# Patient Record
Sex: Female | Born: 1935 | Race: White | Hispanic: No | Smoking: Former smoker
Health system: Southern US, Community
[De-identification: ages and names within clinical notes are randomized; demographics above are authoritative.]

## PROBLEM LIST (undated history)

## (undated) DIAGNOSIS — K2270 Barrett's esophagus without dysplasia: Principal | ICD-10-CM

## (undated) DIAGNOSIS — E785 Hyperlipidemia, unspecified: Secondary | ICD-10-CM

## (undated) DIAGNOSIS — H9190 Unspecified hearing loss, unspecified ear: Secondary | ICD-10-CM

## (undated) DIAGNOSIS — H269 Unspecified cataract: Secondary | ICD-10-CM

## (undated) DIAGNOSIS — R51 Headache: Secondary | ICD-10-CM

## (undated) DIAGNOSIS — R519 Headache, unspecified: Secondary | ICD-10-CM

## (undated) DIAGNOSIS — K219 Gastro-esophageal reflux disease without esophagitis: Secondary | ICD-10-CM

## (undated) DIAGNOSIS — G629 Polyneuropathy, unspecified: Secondary | ICD-10-CM

## (undated) DIAGNOSIS — C801 Malignant (primary) neoplasm, unspecified: Secondary | ICD-10-CM

## (undated) HISTORY — DX: Unspecified hearing loss, unspecified ear: H91.90

## (undated) HISTORY — PX: UPPER GI ENDOSCOPY: SHX6162

## (undated) HISTORY — PX: COLONOSCOPY: SHX174

## (undated) HISTORY — DX: Barrett's esophagus without dysplasia: K22.70

## (undated) HISTORY — DX: Gastro-esophageal reflux disease without esophagitis: K21.9

---

## 1998-06-10 ENCOUNTER — Ambulatory Visit (HOSPITAL_COMMUNITY): Admission: RE | Admit: 1998-06-10 | Discharge: 1998-06-10 | Payer: Self-pay | Admitting: Internal Medicine

## 1998-08-06 ENCOUNTER — Other Ambulatory Visit: Admission: RE | Admit: 1998-08-06 | Discharge: 1998-08-06 | Payer: Self-pay | Admitting: Obstetrics and Gynecology

## 1998-11-04 ENCOUNTER — Other Ambulatory Visit: Admission: RE | Admit: 1998-11-04 | Discharge: 1998-11-04 | Payer: Self-pay | Admitting: Internal Medicine

## 1998-11-08 HISTORY — PX: POLYPECTOMY: SHX149

## 2000-02-01 ENCOUNTER — Other Ambulatory Visit: Admission: RE | Admit: 2000-02-01 | Discharge: 2000-02-01 | Payer: Self-pay | Admitting: Obstetrics and Gynecology

## 2001-01-31 ENCOUNTER — Other Ambulatory Visit: Admission: RE | Admit: 2001-01-31 | Discharge: 2001-01-31 | Payer: Self-pay | Admitting: Obstetrics and Gynecology

## 2001-11-08 HISTORY — PX: BREAST LUMPECTOMY: SHX2

## 2002-04-18 ENCOUNTER — Other Ambulatory Visit: Admission: RE | Admit: 2002-04-18 | Discharge: 2002-04-18 | Payer: Self-pay | Admitting: Obstetrics and Gynecology

## 2002-04-24 ENCOUNTER — Other Ambulatory Visit: Admission: RE | Admit: 2002-04-24 | Discharge: 2002-04-24 | Payer: Self-pay | Admitting: Radiology

## 2002-05-21 ENCOUNTER — Other Ambulatory Visit: Admission: RE | Admit: 2002-05-21 | Discharge: 2002-05-21 | Payer: Self-pay | Admitting: Obstetrics and Gynecology

## 2002-09-04 ENCOUNTER — Ambulatory Visit: Admission: RE | Admit: 2002-09-04 | Discharge: 2002-11-19 | Payer: Self-pay | Admitting: Radiation Oncology

## 2002-09-20 ENCOUNTER — Ambulatory Visit (HOSPITAL_COMMUNITY): Admission: RE | Admit: 2002-09-20 | Discharge: 2002-09-20 | Payer: Self-pay | Admitting: Radiation Oncology

## 2002-09-27 ENCOUNTER — Encounter: Payer: Self-pay | Admitting: Radiation Oncology

## 2002-09-27 ENCOUNTER — Ambulatory Visit (HOSPITAL_COMMUNITY): Admission: RE | Admit: 2002-09-27 | Discharge: 2002-09-27 | Payer: Self-pay | Admitting: Radiation Oncology

## 2003-08-05 ENCOUNTER — Other Ambulatory Visit: Admission: RE | Admit: 2003-08-05 | Discharge: 2003-08-05 | Payer: Self-pay | Admitting: Obstetrics and Gynecology

## 2004-04-08 ENCOUNTER — Encounter: Payer: Self-pay | Admitting: Internal Medicine

## 2004-11-18 ENCOUNTER — Encounter: Admission: RE | Admit: 2004-11-18 | Discharge: 2004-11-18 | Payer: Self-pay | Admitting: Internal Medicine

## 2005-09-17 ENCOUNTER — Ambulatory Visit: Payer: Self-pay | Admitting: Internal Medicine

## 2005-09-22 ENCOUNTER — Ambulatory Visit: Payer: Self-pay | Admitting: Internal Medicine

## 2006-11-22 ENCOUNTER — Encounter: Admission: RE | Admit: 2006-11-22 | Discharge: 2006-11-22 | Payer: Self-pay | Admitting: Internal Medicine

## 2007-07-06 ENCOUNTER — Ambulatory Visit: Payer: Self-pay | Admitting: Internal Medicine

## 2007-09-07 ENCOUNTER — Ambulatory Visit: Payer: Self-pay | Admitting: Internal Medicine

## 2007-09-18 ENCOUNTER — Ambulatory Visit: Payer: Self-pay | Admitting: Internal Medicine

## 2008-02-06 DIAGNOSIS — K2270 Barrett's esophagus without dysplasia: Secondary | ICD-10-CM

## 2008-02-06 DIAGNOSIS — K219 Gastro-esophageal reflux disease without esophagitis: Secondary | ICD-10-CM | POA: Insufficient documentation

## 2008-02-06 DIAGNOSIS — Z853 Personal history of malignant neoplasm of breast: Secondary | ICD-10-CM

## 2008-02-06 DIAGNOSIS — Z8719 Personal history of other diseases of the digestive system: Secondary | ICD-10-CM

## 2008-02-06 HISTORY — DX: Personal history of malignant neoplasm of breast: Z85.3

## 2008-06-20 ENCOUNTER — Encounter: Admission: RE | Admit: 2008-06-20 | Discharge: 2008-06-20 | Payer: Self-pay | Admitting: Internal Medicine

## 2009-08-13 ENCOUNTER — Encounter (INDEPENDENT_AMBULATORY_CARE_PROVIDER_SITE_OTHER): Payer: Self-pay | Admitting: *Deleted

## 2009-09-01 ENCOUNTER — Encounter: Admission: RE | Admit: 2009-09-01 | Discharge: 2009-09-01 | Payer: Self-pay | Admitting: Orthopedic Surgery

## 2009-09-29 ENCOUNTER — Encounter (INDEPENDENT_AMBULATORY_CARE_PROVIDER_SITE_OTHER): Payer: Self-pay | Admitting: *Deleted

## 2009-10-17 ENCOUNTER — Encounter (INDEPENDENT_AMBULATORY_CARE_PROVIDER_SITE_OTHER): Payer: Self-pay | Admitting: *Deleted

## 2009-10-20 ENCOUNTER — Ambulatory Visit: Payer: Self-pay | Admitting: Internal Medicine

## 2009-11-14 ENCOUNTER — Ambulatory Visit: Payer: Self-pay | Admitting: Internal Medicine

## 2009-11-18 ENCOUNTER — Encounter: Payer: Self-pay | Admitting: Internal Medicine

## 2009-12-03 ENCOUNTER — Emergency Department (HOSPITAL_COMMUNITY): Admission: EM | Admit: 2009-12-03 | Discharge: 2009-12-03 | Payer: Self-pay | Admitting: Emergency Medicine

## 2010-10-13 ENCOUNTER — Encounter: Admission: RE | Admit: 2010-10-13 | Discharge: 2010-10-13 | Payer: Self-pay | Admitting: Internal Medicine

## 2010-12-08 NOTE — Letter (Signed)
Summary: [facility] Biopsy Results  [facility]  [address] [place], [place] [address]   Phone: [phone]  Fax: [phone]        November 18, 2009 MRN: [id number]    [facility] [address] [place], [place]  [address]    Dear Ms. Shewell,  I am pleased to inform you that the biopsies taken during your recent endoscopic examination did not show any evidence of cancer upon pathologic examination.There is no evidence of Barrett's esophagus in this specomen  Additional information/recommendations:  __No further action is needed at this time.  Please follow-up with      your primary care physician for your other healthcare needs.  __ Please call [phone] to schedule a return visit to review      your condition.  _xx_ Continue with the treatment plan as outlined on the day of your      exam.  _x_ You should have a repeat endoscopic examination for this problem              in 3 /years.   Please call us if you are having persistent problems or have questions about your condition that have not been fully answered at this time.  Sincerely,  Hart Carwin MD  This letter has been electronically signed by your physician.  Appended Document: Patient Notice-Endo Biopsy Results Letter mailed 01.12.11

## 2010-12-08 NOTE — Procedures (Signed)
Summary: Upper Endoscopy  Patient: [name] Note: All result statuses are Final unless otherwise noted.  Tests: (1) Upper Endoscopy (EGD)   EGD Upper Endoscopy       DONE     [facility]     [address]     [place], [place]  [address]           ENDOSCOPY PROCEDURE REPORT           PATIENT:  Kathleen Neal, Kathleen Neal  MR#:  [id number]     BIRTHDATE:  [birth year], 73 yrs. old  GENDER:  female           ENDOSCOPIST:  [name]. Juanda Chance, MD     Referred by:  Kellie Shropshire, M.D.           PROCEDURE DATE:  11/14/2009     PROCEDURE:  EGD with biopsy     ASA CLASS:  Class II     INDICATIONS:  h/o Barrett's Esophagus int. metaplasia and Goblet     cell on Bx on 09/2005 and again on 09/2007,     pt on prelosec 20 mg qhs with breakthrough Sx's           MEDICATIONS:   Versed 5 mg, Fentanyl 50 mcg     TOPICAL ANESTHETIC:  Exactacain Spray           DESCRIPTION OF PROCEDURE:   After the risks benefits and     alternatives of the procedure were thoroughly explained, informed     consent was obtained.  The  endoscope was introduced through the     mouth and advanced to the second portion of the duodenum, without     limitations.  The instrument was slowly withdrawn as the mucosa     was fully examined.     <<PROCEDUREIMAGES>>           irregular Z-line in the distal esophagus. no evidence of     esophagitis, no hiatal hernia With standard forceps, a biopsy was     obtained and sent to pathology (see image1 and image2).  Otherwise     the examination was normal (see image4 and image3).    Retroflexed     views revealed no abnormalities.    The scope was then withdrawn     from the patient and the procedure completed.           COMPLICATIONS:  None           ENDOSCOPIC IMPRESSION:     1) Irregular Z-line in the distal esophagus     2) Otherwise normal examination     s/p biopsies g-e junction     RECOMMENDATIONS:     increase Prelosec 20mg  to bid dose to reduce the breakthrough  symptoms           REPEAT EXAM:  In 3 year(s) for.           ______________________________     [name]. Juanda Chance, MD           CC:           n.     eSIGNED:   [name]. Brodie at 11/14/2009 10:41 AM           Ron Parker, [id number]  Note: An exclamation mark (!) indicates a result that was not dispersed into the flowsheet. Document Creation Date: 11/14/2009 10:41 AM _______________________________________________________________________  (1) Order result status: Final Collection or observation date-time: 11/14/2009 10:33 Requested  date-time:  Receipt date-time:  Reported date-time:  Referring Physician:   Ordering Physician: Lina Sar [phone]) Specimen Source:  Source: Launa Grill Order Number: [id number] Lab site:   Appended Document: Upper Endoscopy     Procedures Next Due Date:    EGD: 11/2012

## 2010-12-08 NOTE — Miscellaneous (Signed)
Summary: RX prilosec  Clinical Lists Changes  Medications: Added new medication of PRILOSEC OTC 20 MG  TBEC (OMEPRAZOLE MAGNESIUM) 1 twice a day 30 minutes before meals - Signed Rx of PRILOSEC OTC 20 MG  TBEC (OMEPRAZOLE MAGNESIUM) 1 twice a day 30 minutes before meals;  #60 x 10;  Signed;  Entered by: Sherren Kerns RN;  Authorized by: Hart Carwin MD;  Method used: Electronically to [facility] [phone]*, [address], [place], [place]  [address], Ph: [phone], Fax: [phone] Observations: Added new observation of ALLERGY REV: Done (11/14/2009 11:03)    Prescriptions: PRILOSEC OTC 20 MG  TBEC (OMEPRAZOLE MAGNESIUM) 1 twice a day 30 minutes before meals  #60 x 10   Entered by:   Sherren Kerns RN   Authorized by:   Hart Carwin MD   Signed by:   Sherren Kerns RN on 11/14/2009   Method used:   Electronically to        [facility] [phone]* (retail)       [address]       [place], [place]  [address]       Ph: [phone]       Fax: [phone]   RxID:   [id number]

## 2011-01-08 IMAGING — CT CT ABDOMEN W/O CM
3 of 4 series · 13 of 36 positions shown, 19 images · IV contrast (READICAT/WATER)
Comparison: None.

CLINICAL DATA: Left upper quadrant pain and epigastric pain,
esophagus

CT ABDOMEN WITHOUT CONTRAST
TECHNIQUE: Multidetector CT imaging of the abdomen was performed
following the standard protocol without IV contrast.

[Series 3: abd without · axial · non-contrast · 0.67mm/px · z∈[-232,-58]mm · 6 of 50 slices shown, 11 images]
[im 8/50  soft-tissue]
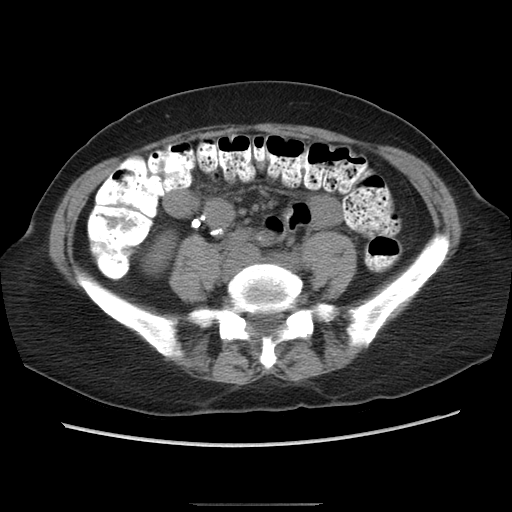
[im 8/50  bone]
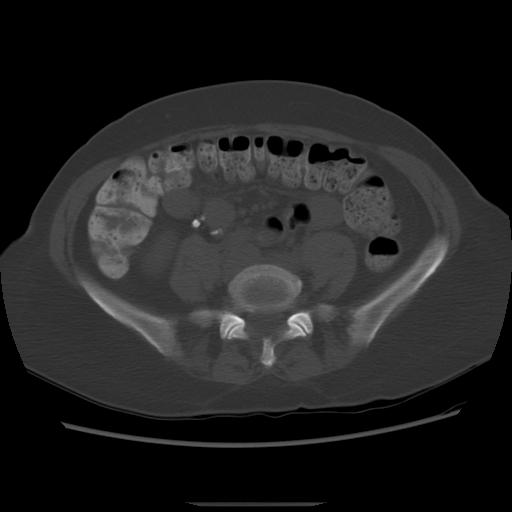
[im 15/50  soft-tissue]
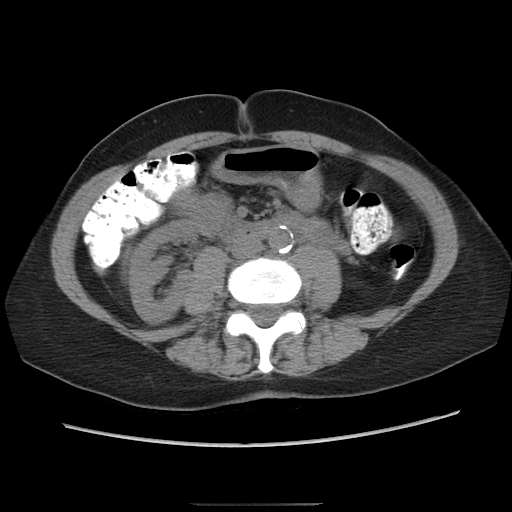
[im 22/50  soft-tissue]
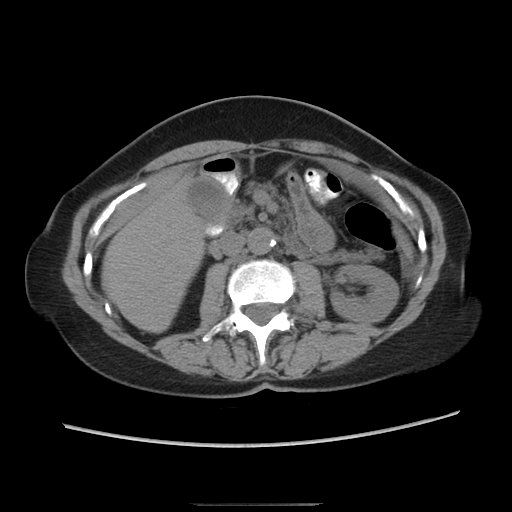
[im 22/50  lung]
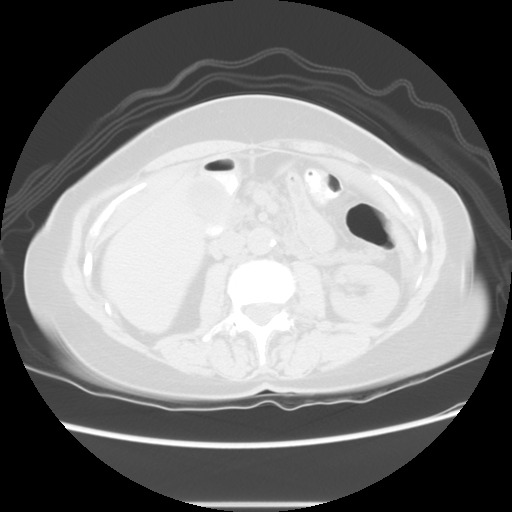
[im 29/50  soft-tissue]
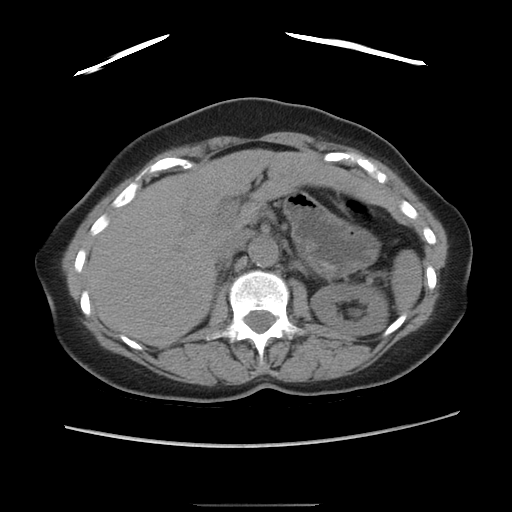
[im 29/50  lung]
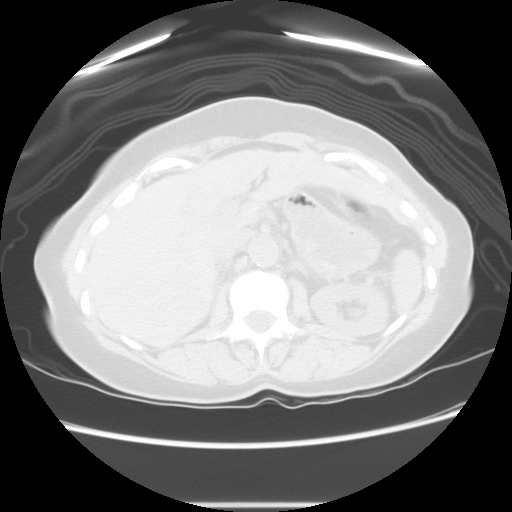
[im 36/50  soft-tissue]
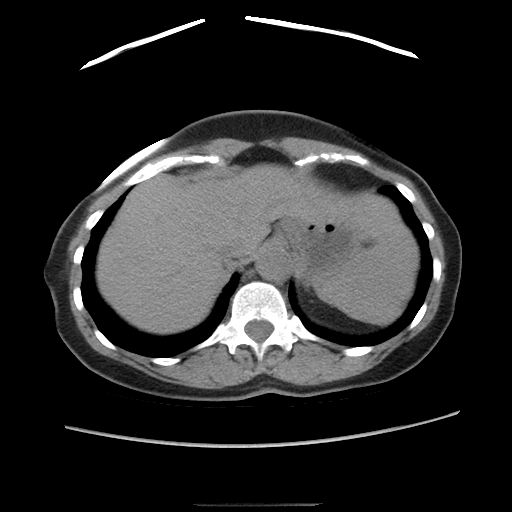
[im 36/50  lung]
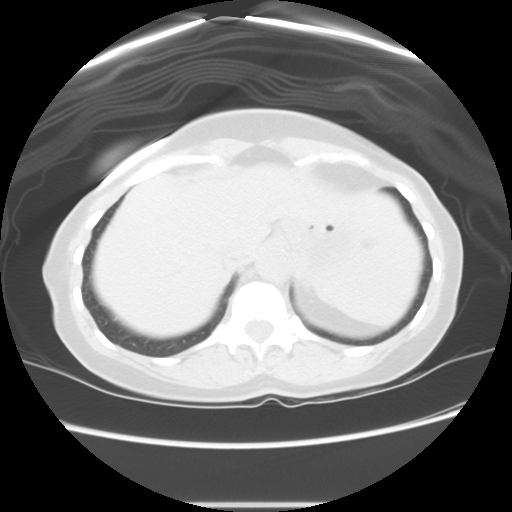
[im 43/50  soft-tissue]
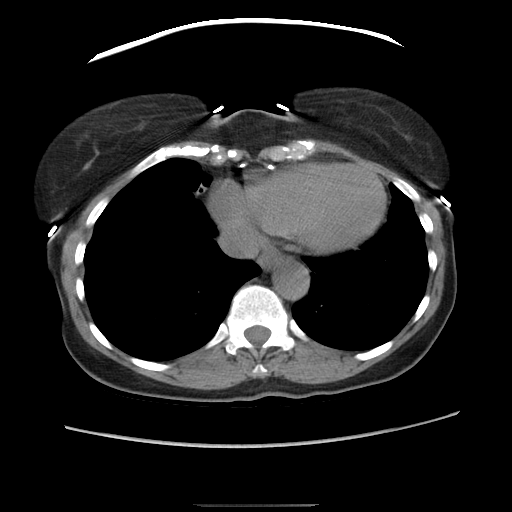
[im 43/50  lung]
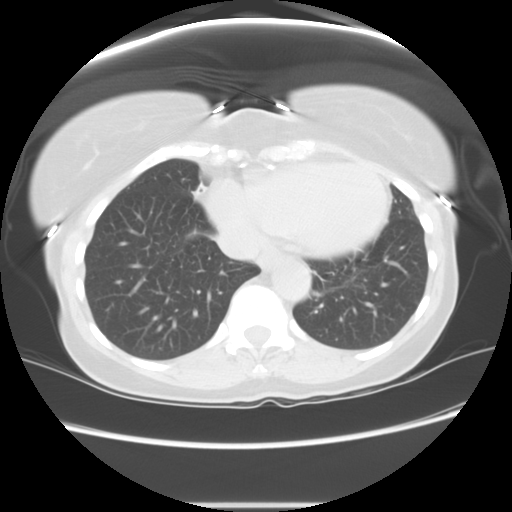

[Series 601: coronal body · coronal · 0.70mm/px · 1 of 100 slices shown, 2 images]
[im 34/100  soft-tissue]
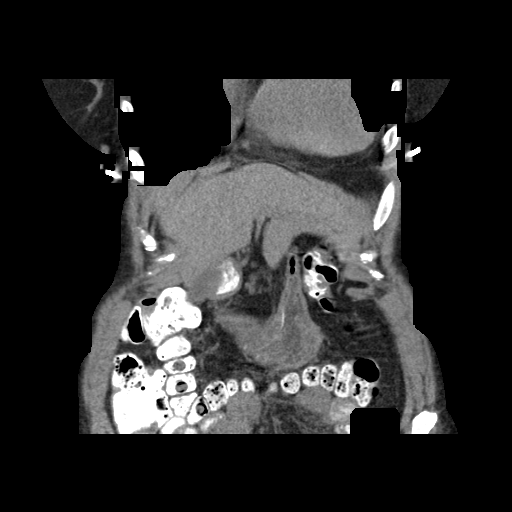
[im 34/100  bone]
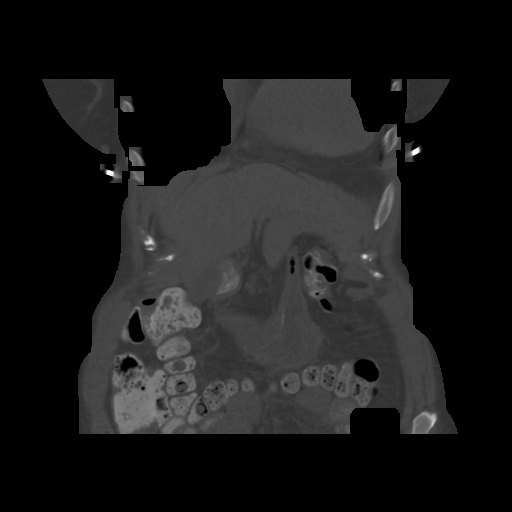

[Series 602: sagittal body · sagittal · 0.70mm/px · 6 of 145 slices shown]
[im 15/145  soft-tissue]
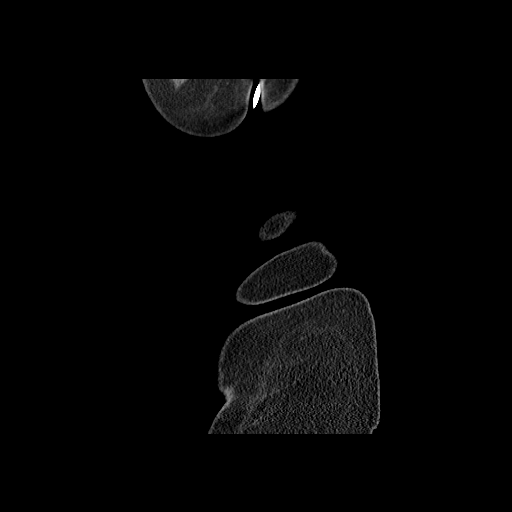
[im 29/145  soft-tissue]
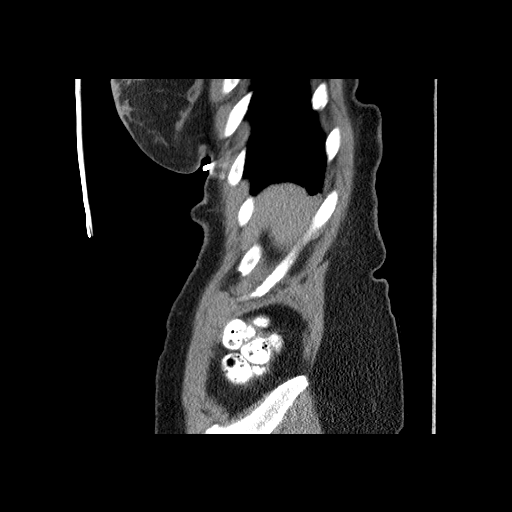
[im 44/145  soft-tissue]
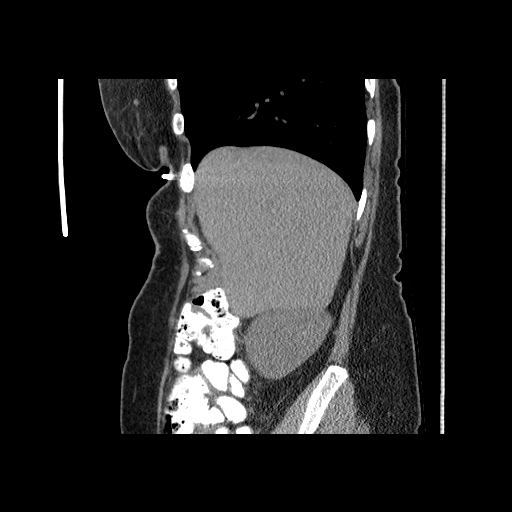
[im 65/145  soft-tissue]
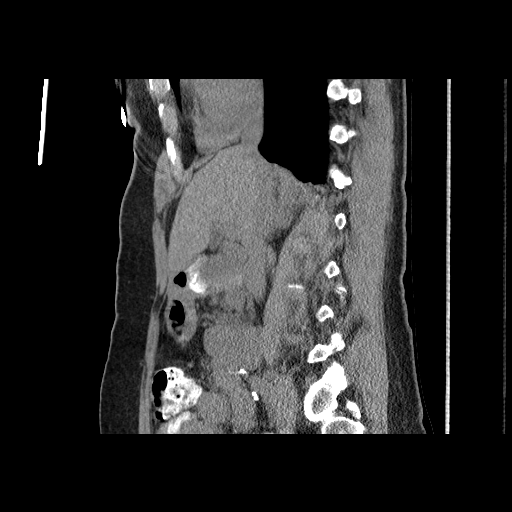
[im 80/145  soft-tissue]
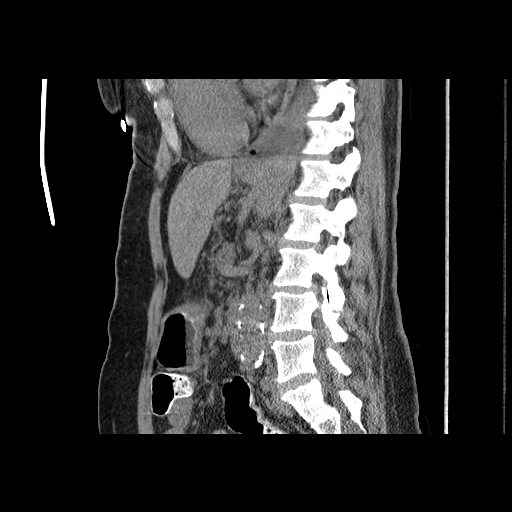
[im 101/145  soft-tissue]
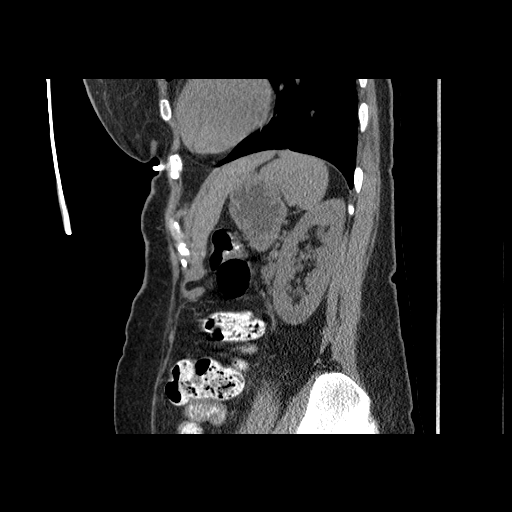

[13 of 36 positions shown; findings below may reference images not displayed]

FINDINGS: The lung bases are clear.  The heart is mildly enlarged.
The liver is unremarkable in the unenhanced state.  The gallbladder
is not well visualized on this unenhanced study and there may be a
somewhat thickened gallbladder wall.  No definite gallstones are
seen.  Ultrasound of the abdomen may be helpful if further
assessment is warranted.  The pancreas is normal in size although
somewhat atrophic.  The adrenal glands are normal as is the spleen.
The stomach is not well distended but is unremarkable.  No renal
calculi are seen and no hydronephrosis is noted.  The abdominal
aorta is normal in caliber.  No adenopathy is seen.  Calcified
nodes are present in the right lower abdomen.  No bony abnormality
is seen.
IMPRESSION: No significant abnormality  unenhanced CT of the abdomen.  Cannot
exclude mild gallbladder wall thickening.  Consider ultrasound of
the abdomen if further assessment is warranted.

## 2011-02-21 ENCOUNTER — Encounter (HOSPITAL_COMMUNITY): Payer: Self-pay | Admitting: Radiology

## 2011-02-21 ENCOUNTER — Emergency Department (HOSPITAL_COMMUNITY)
Admission: EM | Admit: 2011-02-21 | Discharge: 2011-02-21 | Disposition: A | Discharge status: Home / Self Care | Payer: Medicare Other | Attending: Emergency Medicine | Admitting: Emergency Medicine

## 2011-02-21 ENCOUNTER — Emergency Department (HOSPITAL_COMMUNITY): Payer: Medicare Other

## 2011-02-21 DIAGNOSIS — R42 Dizziness and giddiness: Secondary | ICD-10-CM | POA: Insufficient documentation

## 2011-02-21 DIAGNOSIS — Y92009 Unspecified place in unspecified non-institutional (private) residence as the place of occurrence of the external cause: Secondary | ICD-10-CM | POA: Insufficient documentation

## 2011-02-21 DIAGNOSIS — Z853 Personal history of malignant neoplasm of breast: Secondary | ICD-10-CM | POA: Insufficient documentation

## 2011-02-21 DIAGNOSIS — W06XXXA Fall from bed, initial encounter: Secondary | ICD-10-CM | POA: Insufficient documentation

## 2011-02-21 DIAGNOSIS — S060X0A Concussion without loss of consciousness, initial encounter: Secondary | ICD-10-CM | POA: Insufficient documentation

## 2011-02-21 HISTORY — DX: Malignant (primary) neoplasm, unspecified: C80.1

## 2011-03-23 NOTE — Assessment & Plan Note (Signed)
[facility]                         GASTROENTEROLOGY OFFICE NOTE   IYARI, HAGNER                       MRN:          [id number]  DATE:07/06/2007                            DOB:          12-Nov-1935    Ms. Brittingham is a 75 year old white female who was diagnosed with  Barrett's esophagus on upper endoscopy in November 2006.  The biopsy  actually shows the cardia type gastric mucosa with occasional goblet  cells.  There was no metaplasia or dysplasia.  Endoscopically, the  esophagus looked normal.  She came today to discuss possible  radioablation.  She read about it and was interested in some more  definite treatment.  She has a personal history of breast cancer and  fibroepithelial polyp of the rectum on colonoscopy in 2000 and 2005.  She remains asymptomatic on Protonix 40 mg a day.   I have discussed her Barrett's  esophagus with the patient. The fact  that she has only  microscopic changes of goblet cells without evidence  of metaplasia, dysplasia.  The segment is very short , just a few  millimeters.  I think she ought to have a repeat endoscopy which is due  in November 2008.  If the goblet cells and features of Barrett's  esophagus persist, I still would just follow her endoscopically because  her Barrett's esophagus is just microscopic.  She is not an appropriate  candidate for radioablation at this time, because of absence of  clear  dysplasia., the severity of her pathologiy pisture does not warranty  radioablation, which is used for more aggressive types of Barrett's  esophagus.  We will set her up for upper endoscopy, November 2008.     [name]. Juanda Chance, MD  Electronically Signed    DMB/MedQ  DD: 07/06/2007  DT: 07/07/2007  Job #: [id number]   cc:   Merlene Laughter. Renae Gloss, M.D.

## 2011-03-30 ENCOUNTER — Encounter: Payer: Self-pay | Admitting: Internal Medicine

## 2012-02-28 DIAGNOSIS — M5137 Other intervertebral disc degeneration, lumbosacral region: Secondary | ICD-10-CM | POA: Diagnosis not present

## 2012-02-28 DIAGNOSIS — M25559 Pain in unspecified hip: Secondary | ICD-10-CM | POA: Diagnosis not present

## 2012-02-28 DIAGNOSIS — M999 Biomechanical lesion, unspecified: Secondary | ICD-10-CM | POA: Diagnosis not present

## 2012-03-13 DIAGNOSIS — M5137 Other intervertebral disc degeneration, lumbosacral region: Secondary | ICD-10-CM | POA: Diagnosis not present

## 2012-03-13 DIAGNOSIS — M25559 Pain in unspecified hip: Secondary | ICD-10-CM | POA: Diagnosis not present

## 2012-03-13 DIAGNOSIS — M999 Biomechanical lesion, unspecified: Secondary | ICD-10-CM | POA: Diagnosis not present

## 2012-03-14 DIAGNOSIS — M25559 Pain in unspecified hip: Secondary | ICD-10-CM | POA: Diagnosis not present

## 2012-03-14 DIAGNOSIS — M5137 Other intervertebral disc degeneration, lumbosacral region: Secondary | ICD-10-CM | POA: Diagnosis not present

## 2012-03-14 DIAGNOSIS — M999 Biomechanical lesion, unspecified: Secondary | ICD-10-CM | POA: Diagnosis not present

## 2012-03-16 DIAGNOSIS — M25559 Pain in unspecified hip: Secondary | ICD-10-CM | POA: Diagnosis not present

## 2012-03-16 DIAGNOSIS — M5137 Other intervertebral disc degeneration, lumbosacral region: Secondary | ICD-10-CM | POA: Diagnosis not present

## 2012-03-16 DIAGNOSIS — M999 Biomechanical lesion, unspecified: Secondary | ICD-10-CM | POA: Diagnosis not present

## 2012-03-20 DIAGNOSIS — M999 Biomechanical lesion, unspecified: Secondary | ICD-10-CM | POA: Diagnosis not present

## 2012-03-20 DIAGNOSIS — M25559 Pain in unspecified hip: Secondary | ICD-10-CM | POA: Diagnosis not present

## 2012-03-20 DIAGNOSIS — M5137 Other intervertebral disc degeneration, lumbosacral region: Secondary | ICD-10-CM | POA: Diagnosis not present

## 2012-03-21 DIAGNOSIS — M5137 Other intervertebral disc degeneration, lumbosacral region: Secondary | ICD-10-CM | POA: Diagnosis not present

## 2012-03-21 DIAGNOSIS — M25559 Pain in unspecified hip: Secondary | ICD-10-CM | POA: Diagnosis not present

## 2012-03-21 DIAGNOSIS — M999 Biomechanical lesion, unspecified: Secondary | ICD-10-CM | POA: Diagnosis not present

## 2012-03-23 DIAGNOSIS — M25559 Pain in unspecified hip: Secondary | ICD-10-CM | POA: Diagnosis not present

## 2012-03-23 DIAGNOSIS — M999 Biomechanical lesion, unspecified: Secondary | ICD-10-CM | POA: Diagnosis not present

## 2012-03-23 DIAGNOSIS — M5137 Other intervertebral disc degeneration, lumbosacral region: Secondary | ICD-10-CM | POA: Diagnosis not present

## 2012-03-27 DIAGNOSIS — M5137 Other intervertebral disc degeneration, lumbosacral region: Secondary | ICD-10-CM | POA: Diagnosis not present

## 2012-03-27 DIAGNOSIS — M999 Biomechanical lesion, unspecified: Secondary | ICD-10-CM | POA: Diagnosis not present

## 2012-03-27 DIAGNOSIS — M25559 Pain in unspecified hip: Secondary | ICD-10-CM | POA: Diagnosis not present

## 2012-03-28 DIAGNOSIS — M999 Biomechanical lesion, unspecified: Secondary | ICD-10-CM | POA: Diagnosis not present

## 2012-03-28 DIAGNOSIS — M5137 Other intervertebral disc degeneration, lumbosacral region: Secondary | ICD-10-CM | POA: Diagnosis not present

## 2012-03-28 DIAGNOSIS — M25559 Pain in unspecified hip: Secondary | ICD-10-CM | POA: Diagnosis not present

## 2012-03-30 DIAGNOSIS — M5137 Other intervertebral disc degeneration, lumbosacral region: Secondary | ICD-10-CM | POA: Diagnosis not present

## 2012-03-30 DIAGNOSIS — M999 Biomechanical lesion, unspecified: Secondary | ICD-10-CM | POA: Diagnosis not present

## 2012-03-30 DIAGNOSIS — M25559 Pain in unspecified hip: Secondary | ICD-10-CM | POA: Diagnosis not present

## 2012-04-04 DIAGNOSIS — M5137 Other intervertebral disc degeneration, lumbosacral region: Secondary | ICD-10-CM | POA: Diagnosis not present

## 2012-04-04 DIAGNOSIS — M999 Biomechanical lesion, unspecified: Secondary | ICD-10-CM | POA: Diagnosis not present

## 2012-04-04 DIAGNOSIS — M25559 Pain in unspecified hip: Secondary | ICD-10-CM | POA: Diagnosis not present

## 2012-04-05 DIAGNOSIS — M999 Biomechanical lesion, unspecified: Secondary | ICD-10-CM | POA: Diagnosis not present

## 2012-04-05 DIAGNOSIS — M5137 Other intervertebral disc degeneration, lumbosacral region: Secondary | ICD-10-CM | POA: Diagnosis not present

## 2012-04-05 DIAGNOSIS — M25559 Pain in unspecified hip: Secondary | ICD-10-CM | POA: Diagnosis not present

## 2012-04-10 DIAGNOSIS — M25559 Pain in unspecified hip: Secondary | ICD-10-CM | POA: Diagnosis not present

## 2012-04-10 DIAGNOSIS — M5137 Other intervertebral disc degeneration, lumbosacral region: Secondary | ICD-10-CM | POA: Diagnosis not present

## 2012-04-10 DIAGNOSIS — M999 Biomechanical lesion, unspecified: Secondary | ICD-10-CM | POA: Diagnosis not present

## 2012-04-17 DIAGNOSIS — M5137 Other intervertebral disc degeneration, lumbosacral region: Secondary | ICD-10-CM | POA: Diagnosis not present

## 2012-04-17 DIAGNOSIS — M25559 Pain in unspecified hip: Secondary | ICD-10-CM | POA: Diagnosis not present

## 2012-04-17 DIAGNOSIS — M999 Biomechanical lesion, unspecified: Secondary | ICD-10-CM | POA: Diagnosis not present

## 2012-04-18 DIAGNOSIS — M5137 Other intervertebral disc degeneration, lumbosacral region: Secondary | ICD-10-CM | POA: Diagnosis not present

## 2012-04-18 DIAGNOSIS — M25559 Pain in unspecified hip: Secondary | ICD-10-CM | POA: Diagnosis not present

## 2012-04-18 DIAGNOSIS — M999 Biomechanical lesion, unspecified: Secondary | ICD-10-CM | POA: Diagnosis not present

## 2012-04-20 DIAGNOSIS — M5137 Other intervertebral disc degeneration, lumbosacral region: Secondary | ICD-10-CM | POA: Diagnosis not present

## 2012-04-20 DIAGNOSIS — M25559 Pain in unspecified hip: Secondary | ICD-10-CM | POA: Diagnosis not present

## 2012-04-20 DIAGNOSIS — M999 Biomechanical lesion, unspecified: Secondary | ICD-10-CM | POA: Diagnosis not present

## 2012-04-24 DIAGNOSIS — M25559 Pain in unspecified hip: Secondary | ICD-10-CM | POA: Diagnosis not present

## 2012-04-24 DIAGNOSIS — M5137 Other intervertebral disc degeneration, lumbosacral region: Secondary | ICD-10-CM | POA: Diagnosis not present

## 2012-04-24 DIAGNOSIS — M999 Biomechanical lesion, unspecified: Secondary | ICD-10-CM | POA: Diagnosis not present

## 2012-05-03 DIAGNOSIS — M999 Biomechanical lesion, unspecified: Secondary | ICD-10-CM | POA: Diagnosis not present

## 2012-05-03 DIAGNOSIS — M25559 Pain in unspecified hip: Secondary | ICD-10-CM | POA: Diagnosis not present

## 2012-05-03 DIAGNOSIS — M5137 Other intervertebral disc degeneration, lumbosacral region: Secondary | ICD-10-CM | POA: Diagnosis not present

## 2012-06-21 DIAGNOSIS — M1710 Unilateral primary osteoarthritis, unspecified knee: Secondary | ICD-10-CM | POA: Diagnosis not present

## 2012-07-11 DIAGNOSIS — Z13820 Encounter for screening for osteoporosis: Secondary | ICD-10-CM | POA: Diagnosis not present

## 2012-07-11 DIAGNOSIS — C50919 Malignant neoplasm of unspecified site of unspecified female breast: Secondary | ICD-10-CM | POA: Diagnosis not present

## 2012-07-12 DIAGNOSIS — M23329 Other meniscus derangements, posterior horn of medial meniscus, unspecified knee: Secondary | ICD-10-CM | POA: Diagnosis not present

## 2012-07-13 ENCOUNTER — Other Ambulatory Visit: Payer: Self-pay | Admitting: Orthopedic Surgery

## 2012-07-13 DIAGNOSIS — M25562 Pain in left knee: Secondary | ICD-10-CM

## 2012-07-13 DIAGNOSIS — R531 Weakness: Secondary | ICD-10-CM

## 2012-07-16 ENCOUNTER — Ambulatory Visit
Admission: RE | Admit: 2012-07-16 | Discharge: 2012-07-16 | Disposition: A | Discharge status: Home / Self Care | Payer: Medicare Other | Source: Ambulatory Visit | Attending: Orthopedic Surgery | Admitting: Orthopedic Surgery

## 2012-07-16 DIAGNOSIS — M25562 Pain in left knee: Secondary | ICD-10-CM

## 2012-07-16 DIAGNOSIS — IMO0002 Reserved for concepts with insufficient information to code with codable children: Secondary | ICD-10-CM | POA: Diagnosis not present

## 2012-07-16 DIAGNOSIS — R531 Weakness: Secondary | ICD-10-CM

## 2012-07-19 ENCOUNTER — Other Ambulatory Visit: Payer: Medicare Other

## 2012-07-21 DIAGNOSIS — M23329 Other meniscus derangements, posterior horn of medial meniscus, unspecified knee: Secondary | ICD-10-CM | POA: Diagnosis not present

## 2012-07-31 DIAGNOSIS — M9420 Chondromalacia, unspecified site: Secondary | ICD-10-CM | POA: Diagnosis not present

## 2012-07-31 DIAGNOSIS — M23329 Other meniscus derangements, posterior horn of medial meniscus, unspecified knee: Secondary | ICD-10-CM | POA: Diagnosis not present

## 2012-08-07 DIAGNOSIS — M1710 Unilateral primary osteoarthritis, unspecified knee: Secondary | ICD-10-CM | POA: Diagnosis not present

## 2012-08-21 DIAGNOSIS — Z23 Encounter for immunization: Secondary | ICD-10-CM | POA: Diagnosis not present

## 2012-09-05 DIAGNOSIS — K2270 Barrett's esophagus without dysplasia: Secondary | ICD-10-CM | POA: Diagnosis not present

## 2012-09-05 DIAGNOSIS — H9190 Unspecified hearing loss, unspecified ear: Secondary | ICD-10-CM | POA: Diagnosis not present

## 2012-09-05 DIAGNOSIS — G589 Mononeuropathy, unspecified: Secondary | ICD-10-CM | POA: Diagnosis not present

## 2012-09-05 DIAGNOSIS — D126 Benign neoplasm of colon, unspecified: Secondary | ICD-10-CM | POA: Diagnosis not present

## 2012-09-06 DIAGNOSIS — M1710 Unilateral primary osteoarthritis, unspecified knee: Secondary | ICD-10-CM | POA: Diagnosis not present

## 2012-09-14 DIAGNOSIS — Z0000 Encounter for general adult medical examination without abnormal findings: Secondary | ICD-10-CM | POA: Diagnosis not present

## 2012-09-14 DIAGNOSIS — E785 Hyperlipidemia, unspecified: Secondary | ICD-10-CM | POA: Diagnosis not present

## 2012-09-14 DIAGNOSIS — D539 Nutritional anemia, unspecified: Secondary | ICD-10-CM | POA: Diagnosis not present

## 2012-09-20 DIAGNOSIS — Z23 Encounter for immunization: Secondary | ICD-10-CM | POA: Diagnosis not present

## 2012-10-11 DIAGNOSIS — M1710 Unilateral primary osteoarthritis, unspecified knee: Secondary | ICD-10-CM | POA: Diagnosis not present

## 2012-10-27 DIAGNOSIS — H908 Mixed conductive and sensorineural hearing loss, unspecified: Secondary | ICD-10-CM | POA: Diagnosis not present

## 2012-11-03 DIAGNOSIS — Z124 Encounter for screening for malignant neoplasm of cervix: Secondary | ICD-10-CM | POA: Diagnosis not present

## 2012-11-03 DIAGNOSIS — Z01419 Encounter for gynecological examination (general) (routine) without abnormal findings: Secondary | ICD-10-CM | POA: Diagnosis not present

## 2012-11-08 DIAGNOSIS — K2270 Barrett's esophagus without dysplasia: Secondary | ICD-10-CM

## 2012-11-08 HISTORY — PX: OTHER SURGICAL HISTORY: SHX169

## 2012-11-08 HISTORY — DX: Barrett's esophagus without dysplasia: K22.70

## 2012-11-13 DIAGNOSIS — M1710 Unilateral primary osteoarthritis, unspecified knee: Secondary | ICD-10-CM | POA: Diagnosis not present

## 2012-11-15 ENCOUNTER — Encounter: Payer: Self-pay | Admitting: Internal Medicine

## 2013-02-28 ENCOUNTER — Encounter: Payer: Self-pay | Admitting: Internal Medicine

## 2013-03-26 DIAGNOSIS — M1710 Unilateral primary osteoarthritis, unspecified knee: Secondary | ICD-10-CM | POA: Diagnosis not present

## 2013-04-04 ENCOUNTER — Ambulatory Visit (AMBULATORY_SURGERY_CENTER): Payer: Medicare Other | Admitting: *Deleted

## 2013-04-04 VITALS — Ht 63.0 in | Wt 134.0 lb

## 2013-04-04 DIAGNOSIS — K2270 Barrett's esophagus without dysplasia: Secondary | ICD-10-CM

## 2013-04-11 DIAGNOSIS — M1710 Unilateral primary osteoarthritis, unspecified knee: Secondary | ICD-10-CM | POA: Diagnosis not present

## 2013-04-13 ENCOUNTER — Ambulatory Visit (AMBULATORY_SURGERY_CENTER): Payer: Medicare Other | Admitting: Internal Medicine

## 2013-04-13 ENCOUNTER — Encounter: Payer: Self-pay | Admitting: Internal Medicine

## 2013-04-13 VITALS — BP 149/79 | HR 60 | Temp 97.8°F | Resp 26 | Ht 63.0 in | Wt 133.0 lb

## 2013-04-13 DIAGNOSIS — K219 Gastro-esophageal reflux disease without esophagitis: Secondary | ICD-10-CM

## 2013-04-13 DIAGNOSIS — K2270 Barrett's esophagus without dysplasia: Secondary | ICD-10-CM | POA: Diagnosis not present

## 2013-04-13 DIAGNOSIS — Z8719 Personal history of other diseases of the digestive system: Secondary | ICD-10-CM

## 2013-04-13 MED ORDER — SODIUM CHLORIDE 0.9 % IV SOLN: 500.0000 mL | INTRAVENOUS | Status: DC

## 2013-04-13 MED ORDER — OMEPRAZOLE 20 MG PO CPDR: 20.0000 mg | DELAYED_RELEASE_CAPSULE | Freq: Every day | ORAL | Status: DC

## 2013-04-13 NOTE — [place] Notes (Signed)
Called to room to assist during endoscopic procedure.  Patient ID and intended procedure confirmed with present staff. Received instructions for my participation in the procedure from the performing physician.  

## 2013-04-13 NOTE — [place] Notes (Signed)
Patient did not experience any of the following events: a burn prior to discharge; a fall within the facility; wrong site/side/patient/procedure/implant event; or a hospital transfer or hospital admission upon discharge from the facility. (G8907) Patient did not have preoperative order for IV antibiotic SSI prophylaxis. (G8918)  

## 2013-04-13 NOTE — Patient Instructions (Addendum)
YOU HAD AN ENDOSCOPIC PROCEDURE TODAY AT THE [facility]: Refer to the procedure report that was given to you for any specific questions about what was found during the examination.  If the procedure report does not answer your questions, please call your gastroenterologist to clarify.  If you requested that your care partner not be given the details of your procedure findings, then the procedure report has been included in a sealed envelope for you to review at your convenience later.  YOU SHOULD EXPECT: Some feelings of bloating in the abdomen. Passage of more gas than usual.  Walking can help get rid of the air that was put into your GI tract during the procedure and reduce the bloating. If you had a lower endoscopy (such as a colonoscopy or flexible sigmoidoscopy) you may notice spotting of blood in your stool or on the toilet paper. If you underwent a bowel prep for your procedure, then you may not have a normal bowel movement for a few days.  DIET: Your first meal following the procedure should be a light meal and then it is ok to progress to your normal diet.  A half-sandwich or bowl of soup is an example of a good first meal.  Heavy or fried foods are harder to digest and may make you feel nauseous or bloated.  Likewise meals heavy in dairy and vegetables can cause extra gas to form and this can also increase the bloating.  Drink plenty of fluids but you should avoid alcoholic beverages for 24 hours.  ACTIVITY: Your care partner should take you home directly after the procedure.  You should plan to take it easy, moving slowly for the rest of the day.  You can resume normal activity the day after the procedure however you should NOT DRIVE or use heavy machinery for 24 hours (because of the sedation medicines used during the test).    SYMPTOMS TO REPORT IMMEDIATELY: A gastroenterologist can be reached at any hour.  During normal business hours, 8:30 AM to 5:00 PM Monday through Friday,  call [phone].  After hours and on weekends, please call the GI answering service at [phone] who will take a message and have the physician on call contact you.    Following upper endoscopy (EGD)  Vomiting of blood or coffee ground material  New chest pain or pain under the shoulder blades  Painful or persistently difficult swallowing  New shortness of breath  Fever of 100F or higher  Black, tarry-looking stools  FOLLOW UP: If any biopsies were taken you will be contacted by phone or by letter within the next 1-3 weeks.  Call your gastroenterologist if you have not heard about the biopsies in 3 weeks.  Our staff will call the home number listed on your records the next business day following your procedure to check on you and address any questions or concerns that you may have at that time regarding the information given to you following your procedure. This is a courtesy call and so if there is no answer at the home number and we have not heard from you through the emergency physician on call, we will assume that you have returned to your regular daily activities without incident.  SIGNATURES/CONFIDENTIALITY: You and/or your care partner have signed paperwork which will be entered into your electronic medical record.  These signatures attest to the fact that that the information above on your After Visit Summary has been reviewed and is understood.  Full   responsibility of the confidentiality of this discharge information lies with you and/or your care-partner.   Hiatal hernia,  Barrett's esophagus information given.  Resume PPIs

## 2013-04-13 NOTE — Op Note (Signed)
[facility] [address] [place], [address]   ENDOSCOPY PROCEDURE REPORT  PATIENT: Kathleen, Neal  MR#: [id number] BIRTHDATE: 07-31-1936 , 77  yrs. old GENDER: Female ENDOSCOPIST: Hart Carwin, MD REFERRED BY:  Rodrigo Ran, M.D. PROCEDURE DATE:  04/13/2013 PROCEDURE:  EGD w/ biopsy ASA CLASS:     Class II INDICATIONS:  history of Barrett's esophagus.   Barrett's esophagus in 2006 and 2008, no Barrett's 2011,, not taking her PPI. MEDICATIONS: MAC sedation, administered by CRNA and propofol (Diprivan) 100mg  IV TOPICAL ANESTHETIC: Cetacaine Spray  DESCRIPTION OF PROCEDURE: After the risks benefits and alternatives of the procedure were thoroughly explained, informed consent was obtained.  The LB [id number] endoscope was introduced through the mouth and advanced to the second portion of the duodenum. Without limitations.  The instrument was slowly withdrawn as the mucosa was fully examined.      ESOPGAGUS:: esophageal mucosa appeared normal in the proximal mid and distal esophagus. Z line was slightly irregular. Multiple biopsies were taken to followup on Barrett's esophagus, there was no stricture. STOMACH: there was a small reducible hiatal hernia measuring 1-2 cm. Gastric folds appeared normal. Gastric antrum appeared normal. Gastric outlet was unremarkable. Retroflexion of the endoscope revealed normal fundus and cardia Duodenum: duodenal bulb and descending duodenum were normal[ The scope was then withdrawn from the patient and the procedure completed.  COMPLICATIONS: There were no complications. ENDOSCOPIC IMPRESSION: 1. irregular Z line consistent with prior history of Barrett's esophagus. Status post biopsies 2. small 1-2 cm hiatal hernia reducible RECOMMENDATIONS: await results of the biopsies Antireflux measures Resume PPIs REPEAT EXAM: for EGD pending biopsy results.  eSigned:  Hart Carwin, MD 04/13/2013 9:31  AM   CC:

## 2013-04-16 ENCOUNTER — Telehealth: Payer: Self-pay | Admitting: *Deleted

## 2013-04-16 NOTE — Telephone Encounter (Signed)
  Follow up Call-  Call back number 04/13/2013  Post procedure Call Back phone  # [phone]  Permission to leave phone message Yes     Patient questions:  Do you have a fever, pain , or abdominal swelling? no Pain Score  0 *  Have you tolerated food without any problems? yes  Have you been able to return to your normal activities? yes  Do you have any questions about your discharge instructions: Diet   no Medications  no Follow up visit  no  Do you have questions or concerns about your Care? no  Actions: * If pain score is 4 or above: No action needed, pain <4.

## 2013-04-19 ENCOUNTER — Encounter: Payer: Self-pay | Admitting: Internal Medicine

## 2013-04-25 DIAGNOSIS — IMO0002 Reserved for concepts with insufficient information to code with codable children: Secondary | ICD-10-CM | POA: Diagnosis not present

## 2013-04-25 DIAGNOSIS — M25569 Pain in unspecified knee: Secondary | ICD-10-CM | POA: Diagnosis not present

## 2013-05-01 DIAGNOSIS — IMO0002 Reserved for concepts with insufficient information to code with codable children: Secondary | ICD-10-CM | POA: Diagnosis not present

## 2013-05-01 DIAGNOSIS — M25569 Pain in unspecified knee: Secondary | ICD-10-CM | POA: Diagnosis not present

## 2013-05-04 DIAGNOSIS — IMO0002 Reserved for concepts with insufficient information to code with codable children: Secondary | ICD-10-CM | POA: Diagnosis not present

## 2013-05-04 DIAGNOSIS — M25569 Pain in unspecified knee: Secondary | ICD-10-CM | POA: Diagnosis not present

## 2013-05-05 DIAGNOSIS — IMO0002 Reserved for concepts with insufficient information to code with codable children: Secondary | ICD-10-CM | POA: Diagnosis not present

## 2013-05-05 DIAGNOSIS — M25569 Pain in unspecified knee: Secondary | ICD-10-CM | POA: Diagnosis not present

## 2013-05-08 DIAGNOSIS — IMO0002 Reserved for concepts with insufficient information to code with codable children: Secondary | ICD-10-CM | POA: Diagnosis not present

## 2013-05-08 DIAGNOSIS — M25569 Pain in unspecified knee: Secondary | ICD-10-CM | POA: Diagnosis not present

## 2013-08-02 DIAGNOSIS — Z23 Encounter for immunization: Secondary | ICD-10-CM | POA: Diagnosis not present

## 2013-09-25 DIAGNOSIS — C50919 Malignant neoplasm of unspecified site of unspecified female breast: Secondary | ICD-10-CM | POA: Diagnosis not present

## 2013-09-25 DIAGNOSIS — R05 Cough: Secondary | ICD-10-CM | POA: Diagnosis not present

## 2013-09-25 DIAGNOSIS — F17200 Nicotine dependence, unspecified, uncomplicated: Secondary | ICD-10-CM | POA: Diagnosis not present

## 2013-10-19 DIAGNOSIS — Z0000 Encounter for general adult medical examination without abnormal findings: Secondary | ICD-10-CM | POA: Diagnosis not present

## 2013-10-19 DIAGNOSIS — D539 Nutritional anemia, unspecified: Secondary | ICD-10-CM | POA: Diagnosis not present

## 2013-10-19 DIAGNOSIS — G589 Mononeuropathy, unspecified: Secondary | ICD-10-CM | POA: Diagnosis not present

## 2013-10-19 DIAGNOSIS — R350 Frequency of micturition: Secondary | ICD-10-CM | POA: Diagnosis not present

## 2013-10-23 DIAGNOSIS — Z1331 Encounter for screening for depression: Secondary | ICD-10-CM | POA: Diagnosis not present

## 2013-10-23 DIAGNOSIS — Z0000 Encounter for general adult medical examination without abnormal findings: Secondary | ICD-10-CM | POA: Diagnosis not present

## 2013-10-23 DIAGNOSIS — D539 Nutritional anemia, unspecified: Secondary | ICD-10-CM | POA: Diagnosis not present

## 2013-10-23 DIAGNOSIS — K2270 Barrett's esophagus without dysplasia: Secondary | ICD-10-CM | POA: Diagnosis not present

## 2013-10-23 DIAGNOSIS — Z23 Encounter for immunization: Secondary | ICD-10-CM | POA: Diagnosis not present

## 2013-10-23 DIAGNOSIS — G589 Mononeuropathy, unspecified: Secondary | ICD-10-CM | POA: Diagnosis not present

## 2013-10-23 DIAGNOSIS — IMO0002 Reserved for concepts with insufficient information to code with codable children: Secondary | ICD-10-CM | POA: Diagnosis not present

## 2013-10-29 DIAGNOSIS — Z1212 Encounter for screening for malignant neoplasm of rectum: Secondary | ICD-10-CM | POA: Diagnosis not present

## 2013-11-13 ENCOUNTER — Encounter: Payer: Self-pay | Admitting: Nurse Practitioner

## 2013-11-13 ENCOUNTER — Ambulatory Visit (INDEPENDENT_AMBULATORY_CARE_PROVIDER_SITE_OTHER): Payer: Medicare Other | Admitting: Nurse Practitioner

## 2013-11-13 VITALS — BP 130/76 | HR 60 | Ht 63.0 in | Wt 139.0 lb

## 2013-11-13 DIAGNOSIS — Z124 Encounter for screening for malignant neoplasm of cervix: Secondary | ICD-10-CM

## 2013-11-13 DIAGNOSIS — Z01419 Encounter for gynecological examination (general) (routine) without abnormal findings: Secondary | ICD-10-CM | POA: Diagnosis not present

## 2013-11-13 DIAGNOSIS — Z0000 Encounter for general adult medical examination without abnormal findings: Secondary | ICD-10-CM | POA: Diagnosis not present

## 2013-11-13 NOTE — [place] Notes (Signed)
Patient ID: Kathleen Neal, female   DOB: 05-31-1936, 78 y.o.   MRN: [id number] 78 y.o. G0P0 Married Caucasian Fe here for annual exam.    No LMP recorded. Patient is postmenopausal.          Sexually active: no  The current method of family planning is post menopausal status.    Exercising: yes  Home exercise routine includes exercises daily at home, works with trainer once a week. Smoker:  former  Health Maintenance: Pap: 11/03/12, WNL MMG:  08/2013, normal per patient Colonoscopy:   2010, repeat in 5 years BMD:  2011, normal, has apt scheduled next week at PCP TDaP:  08/15/12 Labs:  PCP   reports that she has quit smoking. She quit smokeless tobacco use about 11 years ago. She reports that she drinks about 4.2 ounces of alcohol per week. She reports that she does not use illicit drugs.  Past Medical History  Diagnosis Date  . lt breast ca dx'd 1993    chemo/xrt comp/ left  . Barrett's esophagus   . Hearing loss     left    Past Surgical History  Procedure Laterality Date  . Breast lumpectomy  2003    left  . Left knee      Current Outpatient Prescriptions  Medication Sig Dispense Refill  . aspirin 81 MG tablet Take 81 mg by mouth daily. Takes 3 times a week      . Calcium Carbonate-Vitamin D (CALCIUM + D PO) Take by mouth daily.      . Cholecalciferol (VITAMIN D PO) Take by mouth daily.      . Multiple Vitamin (MULTIVITAMIN) tablet Take 1 tablet by mouth daily.      Marland Kitchen omeprazole (PRILOSEC) 20 MG capsule Take 1 capsule (20 mg total) by mouth daily.  30 capsule  10   No current facility-administered medications for this visit.    Family History  Problem Relation Age of Onset  . Colon cancer Neg Hx   . Cancer Mother     uterine  . Stroke Father 23  . Cancer Sister     pancreatic    ROS:  Pertinent items are noted in HPI.  Otherwise, a comprehensive ROS was negative.  Exam:   BP 130/76  Pulse 60  Ht 5\' 3"  (1.6 m)  Wt 139 lb (63.05 kg)  BMI 24.63 kg/m2 Height:  5\' 3"  (160 cm)  Ht Readings from Last 3 Encounters:  11/13/13 5\' 3"  (1.6 m)  04/13/13 5\' 3"  (1.6 m)  04/04/13 5\' 3"  (1.6 m)    General appearance: alert, cooperative and appears stated age Head: Normocephalic, without obvious abnormality, atraumatic Neck: no adenopathy, supple, symmetrical, trachea midline and thyroid normal to inspection and palpation Lungs: clear to auscultation bilaterally Breasts: normal appearance, no masses or tenderness, on the right, surgical and radiation changes on the left. Heart: regular rate and rhythm Abdomen: soft, non-tender; no masses,  no organomegaly Extremities: extremities normal, atraumatic, no cyanosis or edema Skin: Skin color, texture, turgor normal. No rashes or lesions Lymph nodes: Cervical, supraclavicular, and axillary nodes normal. No abnormal inguinal nodes palpated Neurologic: Grossly normal   Pelvic: External genitalia:  no lesions              Urethra:  normal appearing urethra with no masses, tenderness or lesions              Bartholin's and Skene's: normal  Vagina: normal appearing vagina with normal color and discharge, no lesions              Cervix: anteverted              Pap taken: no Bimanual Exam:  Uterus:  normal size, contour, position, consistency, mobility, non-tender              Adnexa: no mass, fullness, tenderness               Rectovaginal: Confirms               Anus:  normal sphincter tone, no lesions  A:  Well Woman with normal exam  Postmenopausal  S/P left Breast Cancer 2003 treated with chemo and radiation  History of hearing loss  P:   Pap smear as per guidelines   Mammogram due 10/15  Counseled on breast self exam, mammography screening, adequate intake of calcium and vitamin D, diet and exercise, Kegel's exercises return annually or prn  An After Visit Summary was printed and given to the patient.

## 2013-11-13 NOTE — Patient Instructions (Signed)

## 2013-11-18 NOTE — [place] Notes (Signed)
Encounter reviewed by Dr. Brook Silva.  

## 2013-11-20 DIAGNOSIS — J984 Other disorders of lung: Secondary | ICD-10-CM | POA: Diagnosis not present

## 2013-11-20 DIAGNOSIS — R059 Cough, unspecified: Secondary | ICD-10-CM | POA: Diagnosis not present

## 2013-11-20 DIAGNOSIS — F17200 Nicotine dependence, unspecified, uncomplicated: Secondary | ICD-10-CM | POA: Diagnosis not present

## 2013-11-20 DIAGNOSIS — R911 Solitary pulmonary nodule: Secondary | ICD-10-CM | POA: Diagnosis not present

## 2013-11-21 DIAGNOSIS — H5200 Hypermetropia, unspecified eye: Secondary | ICD-10-CM | POA: Diagnosis not present

## 2013-11-21 DIAGNOSIS — H52209 Unspecified astigmatism, unspecified eye: Secondary | ICD-10-CM | POA: Diagnosis not present

## 2013-11-21 DIAGNOSIS — H259 Unspecified age-related cataract: Secondary | ICD-10-CM | POA: Diagnosis not present

## 2013-11-21 DIAGNOSIS — H43819 Vitreous degeneration, unspecified eye: Secondary | ICD-10-CM | POA: Diagnosis not present

## 2013-12-04 DIAGNOSIS — Z780 Asymptomatic menopausal state: Secondary | ICD-10-CM | POA: Diagnosis not present

## 2014-02-12 ENCOUNTER — Encounter: Payer: Self-pay | Admitting: Internal Medicine

## 2014-02-20 DIAGNOSIS — S0120XA Unspecified open wound of nose, initial encounter: Secondary | ICD-10-CM | POA: Diagnosis not present

## 2014-02-20 DIAGNOSIS — S022XXB Fracture of nasal bones, initial encounter for open fracture: Secondary | ICD-10-CM | POA: Diagnosis not present

## 2014-07-13 ENCOUNTER — Encounter: Payer: Self-pay | Admitting: Internal Medicine

## 2014-07-16 ENCOUNTER — Encounter: Payer: Self-pay | Admitting: Internal Medicine

## 2014-07-24 ENCOUNTER — Encounter: Payer: Self-pay | Admitting: Internal Medicine

## 2014-08-20 DIAGNOSIS — Z23 Encounter for immunization: Secondary | ICD-10-CM | POA: Diagnosis not present

## 2014-09-02 ENCOUNTER — Ambulatory Visit (AMBULATORY_SURGERY_CENTER): Payer: Self-pay | Admitting: *Deleted

## 2014-09-02 VITALS — Ht 63.0 in | Wt 136.2 lb

## 2014-09-02 DIAGNOSIS — Z86010 Personal history of colonic polyps: Secondary | ICD-10-CM

## 2014-09-02 MED ORDER — MOVIPREP 100 G PO SOLR: 1.0000 | Freq: Once | ORAL | Status: DC

## 2014-09-02 NOTE — [place] Notes (Signed)
No egg or soy allergy. ewm No home 02 use. ewm Last colon 2005 w/brodie. ewm No issues with past sedation. ewm No diet pills. ewm

## 2014-09-18 ENCOUNTER — Encounter: Payer: Medicare Other | Admitting: Internal Medicine

## 2014-09-24 DIAGNOSIS — R918 Other nonspecific abnormal finding of lung field: Secondary | ICD-10-CM | POA: Diagnosis not present

## 2014-09-24 DIAGNOSIS — Z9889 Other specified postprocedural states: Secondary | ICD-10-CM | POA: Diagnosis not present

## 2014-09-24 DIAGNOSIS — Z170 Estrogen receptor positive status [ER+]: Secondary | ICD-10-CM | POA: Diagnosis not present

## 2014-09-24 DIAGNOSIS — Z87891 Personal history of nicotine dependence: Secondary | ICD-10-CM | POA: Diagnosis not present

## 2014-09-24 DIAGNOSIS — K2270 Barrett's esophagus without dysplasia: Secondary | ICD-10-CM | POA: Diagnosis not present

## 2014-09-24 DIAGNOSIS — I10 Essential (primary) hypertension: Secondary | ICD-10-CM | POA: Diagnosis not present

## 2014-09-24 DIAGNOSIS — C50912 Malignant neoplasm of unspecified site of left female breast: Secondary | ICD-10-CM | POA: Diagnosis not present

## 2014-09-24 DIAGNOSIS — Z79899 Other long term (current) drug therapy: Secondary | ICD-10-CM | POA: Diagnosis not present

## 2014-09-24 DIAGNOSIS — Z853 Personal history of malignant neoplasm of breast: Secondary | ICD-10-CM | POA: Diagnosis not present

## 2014-09-24 DIAGNOSIS — Z886 Allergy status to analgesic agent status: Secondary | ICD-10-CM | POA: Diagnosis not present

## 2014-09-24 DIAGNOSIS — Z9221 Personal history of antineoplastic chemotherapy: Secondary | ICD-10-CM | POA: Diagnosis not present

## 2014-09-24 DIAGNOSIS — Z923 Personal history of irradiation: Secondary | ICD-10-CM | POA: Diagnosis not present

## 2014-10-01 ENCOUNTER — Ambulatory Visit (AMBULATORY_SURGERY_CENTER): Payer: Medicare Other | Admitting: Internal Medicine

## 2014-10-01 ENCOUNTER — Encounter: Payer: Self-pay | Admitting: Internal Medicine

## 2014-10-01 VITALS — BP 152/99 | HR 62 | Temp 96.3°F | Resp 19 | Ht 63.0 in | Wt 136.0 lb

## 2014-10-01 DIAGNOSIS — Z86010 Personal history of colonic polyps: Secondary | ICD-10-CM

## 2014-10-01 DIAGNOSIS — D120 Benign neoplasm of cecum: Secondary | ICD-10-CM

## 2014-10-01 DIAGNOSIS — K635 Polyp of colon: Secondary | ICD-10-CM

## 2014-10-01 MED ORDER — SODIUM CHLORIDE 0.9 % IV SOLN: 500.0000 mL | INTRAVENOUS | Status: DC

## 2014-10-01 NOTE — Op Note (Signed)
[facility] [place]  [address]. [place], [address]   COLONOSCOPY PROCEDURE REPORT  PATIENT: Kathleen Neal, Kathleen Neal  MR#: [id number] BIRTHDATE: Nov 01, 1936 , 78  yrs. old GENDER: female ENDOSCOPIST: Lafayette Dragon, MD REFERRED JJ:OACZ Perini, M.D. PROCEDURE DATE:  10/01/2014 PROCEDURE:   Colonoscopy with cold biopsy polypectomy First Screening Colonoscopy - Avg.  risk and is 50 yrs.  old or older - No.  Prior Negative Screening - Now for repeat screening. 10 or more years since last screening  History of Adenoma - Now for follow-up colonoscopy & has been > or = to 3 yrs.  N/A  Polyps Removed Today? Yes. ASA CLASS:   Class II INDICATIONS:average risk for colon cancer and prior colonoscopy in 2000 showed adenomatous polyps.  Last colonoscopy in June 2005 was normal, history of Barrett's esophagus.  Last endoscopy 2011 did not show Barrett's. MEDICATIONS: Monitored anesthesia care and Propofol 250 mg IV  DESCRIPTION OF PROCEDURE:   After the risks benefits and alternatives of the procedure were thoroughly explained, informed consent was obtained.  The digital rectal exam revealed no abnormalities of the rectum.   The LB [id number]  endoscope was introduced through the anus and advanced to the cecum, which was identified by both the appendix and ileocecal valve. No adverse events experienced.   The quality of the prep was good, using MoviPrep  The instrument was then slowly withdrawn as the colon was fully examined.      COLON FINDINGS: A sessile polyp measuring 2 mm in size was found at the cecum.  A polypectomy was performed with cold forceps.  The resection was complete, the polyp tissue was completely retrieved and sent to histology.  Retroflexed views revealed no abnormalities. The time to cecum=6 minutes 30 seconds.  Withdrawal time=6 minutes 39 seconds.  The scope was withdrawn and the procedure completed. COMPLICATIONS: There were no immediate  complications.  ENDOSCOPIC IMPRESSION: Sessile polyp was found at the cecum; polypectomy was performed with cold forceps  RECOMMENDATIONS: 1.  Await pathology results 2.  Hhigh fiber diet No recall due to age  eSigned:  Lafayette Dragon, MD 10/01/2014 9:03 AM   cc:

## 2014-10-01 NOTE — [place] Notes (Signed)
Called to room to assist during endoscopic procedure.  Patient ID and intended procedure confirmed with present staff. Received instructions for my participation in the procedure from the performing physician.  

## 2014-10-01 NOTE — [place] Notes (Signed)
Pt awake, alert & oriented x3. Pleased with MAC, Report to [facility]

## 2014-10-01 NOTE — Patient Instructions (Signed)
Impressions/recommendations:  Polyp (handout given) High Fiber diet (handout given)  No recall colonoscopy.  YOU HAD AN ENDOSCOPIC PROCEDURE TODAY AT [facility]: Refer to the procedure report that was given to you for any specific questions about what was found during the examination.  If the procedure report does not answer your questions, please call your gastroenterologist to clarify.  If you requested that your care partner not be given the details of your procedure findings, then the procedure report has been included in a sealed envelope for you to review at your convenience later.  YOU SHOULD EXPECT: Some feelings of bloating in the abdomen. Passage of more gas than usual.  Walking can help get rid of the air that was put into your GI tract during the procedure and reduce the bloating. If you had a lower endoscopy (such as a colonoscopy or flexible sigmoidoscopy) you may notice spotting of blood in your stool or on the toilet paper. If you underwent a bowel prep for your procedure, then you may not have a normal bowel movement for a few days.  DIET: Your first meal following the procedure should be a light meal and then it is ok to progress to your normal diet.  A half-sandwich or bowl of soup is an example of a good first meal.  Heavy or fried foods are harder to digest and may make you feel nauseous or bloated.  Likewise meals heavy in dairy and vegetables can cause extra gas to form and this can also increase the bloating.  Drink plenty of fluids but you should avoid alcoholic beverages for 24 hours.  ACTIVITY: Your care partner should take you home directly after the procedure.  You should plan to take it easy, moving slowly for the rest of the day.  You can resume normal activity the day after the procedure however you should NOT DRIVE or use heavy machinery for 24 hours (because of the sedation medicines used during the test).    SYMPTOMS TO REPORT IMMEDIATELY: A  gastroenterologist can be reached at any hour.  During normal business hours, 8:30 AM to 5:00 PM Monday through Friday, call [phone].  After hours and on weekends, please call the GI answering service at [phone] who will take a message and have the physician on call contact you.   Following lower endoscopy (colonoscopy or flexible sigmoidoscopy):  Excessive amounts of blood in the stool  Significant tenderness or worsening of abdominal pains  Swelling of the abdomen that is new, acute  Fever of 100F or higher  FOLLOW UP: If any biopsies were taken you will be contacted by phone or by letter within the next 1-3 weeks.  Call your gastroenterologist if you have not heard about the biopsies in 3 weeks.  Our staff will call the home number listed on your records the next business day following your procedure to check on you and address any questions or concerns that you may have at that time regarding the information given to you following your procedure. This is a courtesy call and so if there is no answer at the home number and we have not heard from you through the emergency physician on call, we will assume that you have returned to your regular daily activities without incident.  SIGNATURES/CONFIDENTIALITY: You and/or your care partner have signed paperwork which will be entered into your electronic medical record.  These signatures attest to the fact that that the information above on your After Visit  Summary has been reviewed and is understood.  Full responsibility of the confidentiality of this discharge information lies with you and/or your care-partner. 

## 2014-10-02 ENCOUNTER — Telehealth: Payer: Self-pay | Admitting: *Deleted

## 2014-10-02 NOTE — Telephone Encounter (Signed)
  Follow up Call-  Call back number 10/01/2014 04/13/2013  Post procedure Call Back phone  # [phone]  Permission to leave phone message Yes Yes     Patient questions:  Do you have a fever, pain , or abdominal swelling? No. Pain Score  0 *  Have you tolerated food without any problems? Yes.    Have you been able to return to your normal activities? Yes.    Do you have any questions about your discharge instructions: Diet   No. Medications  No. Follow up visit  No.  Do you have questions or concerns about your Care? No.  Actions: * If pain score is 4 or above: No action needed, pain <4.

## 2014-10-09 ENCOUNTER — Encounter: Payer: Self-pay | Admitting: Internal Medicine

## 2014-10-10 DIAGNOSIS — Z6824 Body mass index (BMI) 24.0-24.9, adult: Secondary | ICD-10-CM | POA: Diagnosis not present

## 2014-10-10 DIAGNOSIS — G459 Transient cerebral ischemic attack, unspecified: Secondary | ICD-10-CM | POA: Diagnosis not present

## 2014-10-12 DIAGNOSIS — H539 Unspecified visual disturbance: Secondary | ICD-10-CM | POA: Diagnosis not present

## 2014-10-12 DIAGNOSIS — R410 Disorientation, unspecified: Secondary | ICD-10-CM | POA: Diagnosis not present

## 2014-10-14 ENCOUNTER — Ambulatory Visit (HOSPITAL_COMMUNITY)
Admission: RE | Admit: 2014-10-14 | Discharge: 2014-10-14 | Disposition: A | Discharge status: Home / Self Care | Payer: Medicare Other | Source: Ambulatory Visit | Attending: Surgery | Admitting: Surgery

## 2014-10-14 ENCOUNTER — Other Ambulatory Visit (HOSPITAL_COMMUNITY): Payer: Self-pay | Admitting: Family Medicine

## 2014-10-14 DIAGNOSIS — G459 Transient cerebral ischemic attack, unspecified: Secondary | ICD-10-CM | POA: Insufficient documentation

## 2014-11-11 DIAGNOSIS — D539 Nutritional anemia, unspecified: Secondary | ICD-10-CM | POA: Diagnosis not present

## 2014-11-11 DIAGNOSIS — G459 Transient cerebral ischemic attack, unspecified: Secondary | ICD-10-CM | POA: Diagnosis not present

## 2014-11-11 DIAGNOSIS — Z008 Encounter for other general examination: Secondary | ICD-10-CM | POA: Diagnosis not present

## 2014-11-15 DIAGNOSIS — C50919 Malignant neoplasm of unspecified site of unspecified female breast: Secondary | ICD-10-CM | POA: Diagnosis not present

## 2014-11-15 DIAGNOSIS — Z1389 Encounter for screening for other disorder: Secondary | ICD-10-CM | POA: Diagnosis not present

## 2014-11-15 DIAGNOSIS — G459 Transient cerebral ischemic attack, unspecified: Secondary | ICD-10-CM | POA: Diagnosis not present

## 2014-11-15 DIAGNOSIS — H9190 Unspecified hearing loss, unspecified ear: Secondary | ICD-10-CM | POA: Diagnosis not present

## 2014-11-15 DIAGNOSIS — I6529 Occlusion and stenosis of unspecified carotid artery: Secondary | ICD-10-CM | POA: Diagnosis not present

## 2014-11-15 DIAGNOSIS — G629 Polyneuropathy, unspecified: Secondary | ICD-10-CM | POA: Diagnosis not present

## 2014-11-15 DIAGNOSIS — Z6824 Body mass index (BMI) 24.0-24.9, adult: Secondary | ICD-10-CM | POA: Diagnosis not present

## 2014-11-15 DIAGNOSIS — Z0000 Encounter for general adult medical examination without abnormal findings: Secondary | ICD-10-CM | POA: Diagnosis not present

## 2014-11-15 DIAGNOSIS — K2270 Barrett's esophagus without dysplasia: Secondary | ICD-10-CM | POA: Diagnosis not present

## 2014-11-19 ENCOUNTER — Encounter: Payer: Self-pay | Admitting: Nurse Practitioner

## 2014-11-19 ENCOUNTER — Ambulatory Visit (INDEPENDENT_AMBULATORY_CARE_PROVIDER_SITE_OTHER): Payer: Medicare Other | Admitting: Nurse Practitioner

## 2014-11-19 VITALS — BP 136/90 | HR 72 | Ht 63.0 in | Wt 138.0 lb

## 2014-11-19 DIAGNOSIS — G459 Transient cerebral ischemic attack, unspecified: Secondary | ICD-10-CM | POA: Diagnosis not present

## 2014-11-19 DIAGNOSIS — Z01419 Encounter for gynecological examination (general) (routine) without abnormal findings: Secondary | ICD-10-CM | POA: Diagnosis not present

## 2014-11-19 DIAGNOSIS — Z124 Encounter for screening for malignant neoplasm of cervix: Secondary | ICD-10-CM

## 2014-11-19 DIAGNOSIS — C50912 Malignant neoplasm of unspecified site of left female breast: Secondary | ICD-10-CM

## 2014-11-19 NOTE — Patient Instructions (Signed)

## 2014-11-19 NOTE — Progress Notes (Signed)
Patient ID: Kathleen Neal, female   DOB: January 31, 1936, 79 y.o.   MRN: [id number] 79 y.o. G0P0 Married  Caucasian Fe here for annual exam.  She had a TIA about 4 weeks ago. She was at a friends home and all of a sudden was unable to talk or get out of a chair.  After a few minutes she was then confused as to how to get out the correct door.  No loss of motor function.  The friend drove her home and she sought medical care the next day. CT scan was normal.  Then had carotid doppler test showing bilateral stenosis of less than 40 %.  She was started on statins.   Patient's last menstrual period was 11/08/1985.          Sexually active: no  The current method of family planning is post menopausal status.  Exercising: yes Home exercise routine includes exercises daily at home, works with trainer once a week. Smoker: former  Health Maintenance: Pap: 11/03/12, WNL MMG: 08/2014, normal per patient Colonoscopy: 10/01/14, small polyp that was benign, repeat in 10 years BMD: 2015, normal, no additional calcium per needed PCP TDaP: 08/15/12 Shingles vaccine: 2013 Labs:  PCP   reports that she quit smoking about 12 years ago. She quit smokeless tobacco use about 12 years ago. She reports that she drinks about 4.2 oz of alcohol per week. She reports that she does not use illicit drugs.  Past Medical History  Diagnosis Date  . Barrett's esophagus 2014  . Hearing loss     left  . GERD (gastroesophageal reflux disease)   . lt breast ca dx'd 2003    chemo/xrt comp/ left    Past Surgical History  Procedure Laterality Date  . Left knee  2014    meniscus  . Colonoscopy      last colon 2005  . Polypectomy  2000  . Breast lumpectomy  2003    left    Current Outpatient Prescriptions  Medication Sig Dispense Refill  . aspirin 81 MG tablet Take 81 mg by mouth daily. Takes 3 times a week    . Cholecalciferol (VITAMIN D PO) Take 1 tablet by mouth daily.     . Multiple Vitamin (MULTIVITAMIN)  tablet Take 1 tablet by mouth daily.    Marland Kitchen omeprazole (PRILOSEC) 20 MG capsule Take 1 capsule (20 mg total) by mouth daily. 30 capsule 10  . pravastatin (PRAVACHOL) 40 MG tablet Take 40 mg by mouth at bedtime.  0  . zolpidem (AMBIEN) 5 MG tablet Take 1 tablet by mouth at bedtime as needed.  0   No current facility-administered medications for this visit.      ROS:  Pertinent items are noted in HPI.  Otherwise, a comprehensive ROS was negative.  Exam:   BP 136/90 mmHg  Pulse 72  Ht 5\' 3"  (1.6 m)  Wt 138 lb (62.596 kg)  BMI 24.45 kg/m2  LMP 11/08/1985 Height: 5\' 3"  (160 cm)  General appearance: alert, cooperative and appears stated age Head: Normocephalic, without obvious abnormality, atraumatic Neck: no adenopathy, supple, symmetrical, trachea midline and thyroid normal to inspection and palpation Lungs: clear to auscultation bilaterally Breasts: normal appearance, no masses or tenderness Heart: regular rate and rhythm Abdomen: soft, non-tender; no masses,  no organomegaly Extremities: extremities normal, atraumatic, no cyanosis or edema Skin: Skin color, texture, turgor normal. No rashes or lesions Lymph nodes: Cervical, supraclavicular, and axillary nodes normal. No abnormal inguinal nodes palpated Neurologic: Grossly normal  Pelvic: External genitalia:  no lesions              Urethra:  normal appearing urethra with no masses, tenderness or lesions              Bartholin's and Skene's: normal                 Vagina: normal appearing vagina with normal color and discharge, no lesions              Cervix: anteverted              Pap taken: No. Bimanual Exam:  Uterus:  normal size, contour, position, consistency, mobility, non-tender              Adnexa: no mass, fullness, tenderness               Rectovaginal: Confirms               Anus:  normal sphincter tone, no lesions  Chaperone present: no  A:  Well Woman with normal exam  Postmenopausal part of [facility] study with  HRT X 14 years until 2003 S/P left Breast Cancer 2003 treated with chemo and radiation History of hearing loss  Barrett's esophagitis & GERD  Recent TIA 10/2014  [facility]: Pancreatic and UTE cancer  P:   Reviewed health and wellness pertinent to exam  Pap smear not taken today  Mammogram is due 10/16  Counseled on breast self exam, mammography screening, adequate intake of calcium and vitamin D, diet and exercise return annually or prn  An After Visit Summary was printed and given to the patient.

## 2014-11-20 DIAGNOSIS — R918 Other nonspecific abnormal finding of lung field: Secondary | ICD-10-CM | POA: Diagnosis not present

## 2014-11-20 DIAGNOSIS — Z0389 Encounter for observation for other suspected diseases and conditions ruled out: Secondary | ICD-10-CM | POA: Diagnosis not present

## 2014-11-20 DIAGNOSIS — C50912 Malignant neoplasm of unspecified site of left female breast: Secondary | ICD-10-CM | POA: Diagnosis not present

## 2014-11-22 DIAGNOSIS — H43813 Vitreous degeneration, bilateral: Secondary | ICD-10-CM | POA: Diagnosis not present

## 2014-11-22 DIAGNOSIS — H04123 Dry eye syndrome of bilateral lacrimal glands: Secondary | ICD-10-CM | POA: Diagnosis not present

## 2014-11-22 DIAGNOSIS — H2513 Age-related nuclear cataract, bilateral: Secondary | ICD-10-CM | POA: Diagnosis not present

## 2014-11-22 DIAGNOSIS — H5203 Hypermetropia, bilateral: Secondary | ICD-10-CM | POA: Diagnosis not present

## 2014-11-24 NOTE — Progress Notes (Signed)
Encounter reviewed by Dr. Manisha Cancel Silva.  

## 2014-12-25 ENCOUNTER — Ambulatory Visit (INDEPENDENT_AMBULATORY_CARE_PROVIDER_SITE_OTHER): Payer: Medicare Other

## 2014-12-25 ENCOUNTER — Ambulatory Visit (INDEPENDENT_AMBULATORY_CARE_PROVIDER_SITE_OTHER): Payer: Medicare Other | Admitting: Emergency Medicine

## 2014-12-25 VITALS — BP 152/60 | HR 71 | Temp 97.5°F | Resp 16 | Ht 63.0 in | Wt 139.8 lb

## 2014-12-25 DIAGNOSIS — T3390XA Superficial frostbite of unspecified sites, initial encounter: Secondary | ICD-10-CM

## 2014-12-25 DIAGNOSIS — Z23 Encounter for immunization: Secondary | ICD-10-CM | POA: Diagnosis not present

## 2014-12-25 DIAGNOSIS — M2392 Unspecified internal derangement of left knee: Secondary | ICD-10-CM

## 2014-12-25 MED ORDER — SILVER SULFADIAZINE 1 % EX CREA: 1.0000 "application " | TOPICAL_CREAM | Freq: Every day | CUTANEOUS | Status: DC

## 2014-12-25 MED ORDER — NAPROXEN SODIUM 550 MG PO TABS: 550.0000 mg | ORAL_TABLET | Freq: Two times a day (BID) | ORAL | Status: DC

## 2014-12-25 NOTE — Patient Instructions (Signed)

## 2014-12-25 NOTE — Progress Notes (Signed)
[facility] [address] [place] [address] [phone]  Date:  12/25/2014   Name:  Kathleen Neal   DOB:  03-11-1936   MRN:  [id number]  PCP:  Kathleen Ly, MD    Chief Complaint: Fall and Knee Pain   History of Present Illness:  Kathleen Neal is a 79 y.o. very pleasant female patient who presents with the following:  Slipped on sidewalk and injured her left knee yesterday.  Applied ice to knee with no padding Now has knee pain and blistering from the ice. Not current on TD Pain increases with ambulation and flexion Less when rests with leg elevated. No improvement with over the counter medications or other home remedies.  Denies other complaint or health concern today.   Patient Active Problem List   Diagnosis Date Noted  . GERD 02/06/2008  . BARRETTS ESOPHAGUS 02/06/2008  . ADENOCARCINOMA, BREAST, HX OF 02/06/2008  . HELICOBACTER PYLORI GASTRITIS, HX OF 02/06/2008    Past Medical History  Diagnosis Date  . Barrett's esophagus 2014  . Hearing loss     left  . GERD (gastroesophageal reflux disease)   . lt breast ca dx'd 2003    chemo/xrt comp/ left    Past Surgical History  Procedure Laterality Date  . Left knee  2014    meniscus  . Colonoscopy      last colon 2005  . Polypectomy  2000  . Breast lumpectomy  2003    left    History  Substance Use Topics  . Smoking status: Former Smoker -- 1.00 packs/day for 30 years    Quit date: 04/08/2002  . Smokeless tobacco: Former Systems developer    Quit date: 04/04/2002  . Alcohol Use: 4.2 oz/week    7 Glasses of wine per week    Family History  Problem Relation Age of Onset  . Colon cancer Neg Hx   . Cancer Mother     uterine  . Stroke Father 25  . Cancer Sister     pancreatic    Allergies  Allergen Reactions  . Codeine     REACTION: nausea    Medication list has been reviewed and updated.  Current Outpatient Prescriptions on File Prior to Visit  Medication Sig Dispense Refill   . aspirin 81 MG tablet Take 81 mg by mouth daily. Takes 3 times a week    . Cholecalciferol (VITAMIN D PO) Take 1 tablet by mouth daily.     . Multiple Vitamin (MULTIVITAMIN) tablet Take 1 tablet by mouth daily.    Marland Kitchen omeprazole (PRILOSEC) 20 MG capsule Take 1 capsule (20 mg total) by mouth daily. 30 capsule 10  . pravastatin (PRAVACHOL) 40 MG tablet Take 40 mg by mouth at bedtime.  0  . zolpidem (AMBIEN) 5 MG tablet Take 1 tablet by mouth at bedtime as needed.  0   No current facility-administered medications on file prior to visit.    Review of Systems:  As per HPI, otherwise negative.    Physical Examination: Filed Vitals:   12/25/14 1042  BP: 152/60  Pulse: 71  Temp: 97.5 F (36.4 C)  Resp: 16   Filed Vitals:   12/25/14 1042  Height: 5\' 3"  (1.6 m)  Weight: 139 lb 12.8 oz (63.413 kg)   Body mass index is 24.77 kg/(m^2). Ideal Body Weight: Weight in (lb) to have BMI = 25: 140.8   GEN: WDWN, NAD, Non-toxic, Alert & Oriented x 3 HEENT: Atraumatic, Normocephalic.  Ears  and Nose: No external deformity. EXTR: No clubbing/cyanosis/edema NEURO: Normal gait.  PSYCH: Normally interactive. Conversant. Not depressed or anxious appearing.  Calm demeanor.  LEFT knee:  Effusion.  Cannot flex past 90 degrees.  Vesicles on lateral and medial knee.  Assessment and Plan: Contusion knee Anaprox Frostbite Silvadene dressing  Signed,  Ellison Carwin, MD   [facility] reading (PRIMARY) by  Dr. Ouida Sills.  negative.

## 2015-05-06 DIAGNOSIS — M7088 Other soft tissue disorders related to use, overuse and pressure other site: Secondary | ICD-10-CM | POA: Diagnosis not present

## 2015-07-28 DIAGNOSIS — M25472 Effusion, left ankle: Secondary | ICD-10-CM | POA: Diagnosis not present

## 2015-08-20 DIAGNOSIS — M25472 Effusion, left ankle: Secondary | ICD-10-CM | POA: Diagnosis not present

## 2015-08-20 DIAGNOSIS — S90111A Contusion of right great toe without damage to nail, initial encounter: Secondary | ICD-10-CM | POA: Diagnosis not present

## 2015-08-28 DIAGNOSIS — Z23 Encounter for immunization: Secondary | ICD-10-CM | POA: Diagnosis not present

## 2015-09-30 DIAGNOSIS — Z853 Personal history of malignant neoplasm of breast: Secondary | ICD-10-CM | POA: Diagnosis not present

## 2015-09-30 DIAGNOSIS — C50912 Malignant neoplasm of unspecified site of left female breast: Secondary | ICD-10-CM | POA: Diagnosis not present

## 2015-09-30 DIAGNOSIS — Z08 Encounter for follow-up examination after completed treatment for malignant neoplasm: Secondary | ICD-10-CM | POA: Diagnosis not present

## 2015-09-30 DIAGNOSIS — Z79899 Other long term (current) drug therapy: Secondary | ICD-10-CM | POA: Diagnosis not present

## 2015-09-30 DIAGNOSIS — Z87891 Personal history of nicotine dependence: Secondary | ICD-10-CM | POA: Diagnosis not present

## 2015-09-30 DIAGNOSIS — Z885 Allergy status to narcotic agent status: Secondary | ICD-10-CM | POA: Diagnosis not present

## 2015-09-30 DIAGNOSIS — I10 Essential (primary) hypertension: Secondary | ICD-10-CM | POA: Diagnosis not present

## 2015-11-10 DIAGNOSIS — H02831 Dermatochalasis of right upper eyelid: Secondary | ICD-10-CM | POA: Diagnosis not present

## 2015-11-10 DIAGNOSIS — H524 Presbyopia: Secondary | ICD-10-CM | POA: Diagnosis not present

## 2015-11-10 DIAGNOSIS — H04123 Dry eye syndrome of bilateral lacrimal glands: Secondary | ICD-10-CM | POA: Diagnosis not present

## 2015-11-10 DIAGNOSIS — H25813 Combined forms of age-related cataract, bilateral: Secondary | ICD-10-CM | POA: Diagnosis not present

## 2015-11-24 ENCOUNTER — Ambulatory Visit (INDEPENDENT_AMBULATORY_CARE_PROVIDER_SITE_OTHER): Payer: Medicare Other | Admitting: Nurse Practitioner

## 2015-11-24 ENCOUNTER — Encounter: Payer: Self-pay | Admitting: Nurse Practitioner

## 2015-11-24 VITALS — BP 136/84 | HR 60 | Ht 63.0 in | Wt 138.0 lb

## 2015-11-24 DIAGNOSIS — C50912 Malignant neoplasm of unspecified site of left female breast: Secondary | ICD-10-CM

## 2015-11-24 DIAGNOSIS — R87619 Unspecified abnormal cytological findings in specimens from cervix uteri: Secondary | ICD-10-CM

## 2015-11-24 DIAGNOSIS — Z0000 Encounter for general adult medical examination without abnormal findings: Secondary | ICD-10-CM

## 2015-11-24 DIAGNOSIS — Z01419 Encounter for gynecological examination (general) (routine) without abnormal findings: Secondary | ICD-10-CM | POA: Diagnosis not present

## 2015-11-24 NOTE — Patient Instructions (Signed)

## 2015-11-24 NOTE — Progress Notes (Signed)
Patient ID: Kathleen Neal, female   DOB: 03-11-1936, 80 y.o.   MRN: [id number]  80 y.o. G0P0 Married  Caucasian Fe here for annual exam.  They still live at a retirement facility and husband will be moving to the next level of care due to his many health issues mostly with DM.  She feels tired all the time trying to care for him.   She is still doing well since TIA last year.  She remains on statins and ASA daily with history of < 40 % carotid artery disease. She is going to [facility] tomorrow for a chest X ray due to history of pulmonary nodules.  This is new study looking at women who have smoked in their lifetime. (smoked for 30 yrs).  No new issues with breast cancer and last Mammo was normal.  Patient's last menstrual period was 11/08/1985 (approximate).          Sexually active: No.  The current method of family planning is none.    Exercising: Yes.    personal trainer once a week for mobility and strength, home workout at least 2 more days Smoker:  no  Health Maintenance: Pap:  11/03/12, Negative MMG:  08/2015 per patient Colonoscopy:  10/01/14, polyp benign, no repeat due to age BMD:   08/2015, per patient - normal TDaP:  08/15/12 Shingles: completed Pneumonia: completed Hep C and HIV: Not indicated due to age Labs: PCP   reports that she quit smoking about 13 years ago. She quit smokeless tobacco use about 13 years ago. She reports that she drinks about 4.2 oz of alcohol per week. She reports that she does not use illicit drugs.  Past Medical History  Diagnosis Date  . Barrett's esophagus 2014  . Hearing loss     left  . GERD (gastroesophageal reflux disease)   . lt breast ca dx'd 2003    chemo/xrt comp/ left    Past Surgical History  Procedure Laterality Date  . Left knee  2014    meniscus  . Colonoscopy      last colon 2005  . Polypectomy  2000  . Breast lumpectomy  2003    left    Current Outpatient Prescriptions  Medication Sig Dispense Refill  . aspirin 81 MG  tablet Take 81 mg by mouth daily. Takes 3 times a week    . Cholecalciferol (VITAMIN D PO) Take 1,000 Units by mouth daily.     Marland Kitchen omeprazole (PRILOSEC) 20 MG capsule Take 1 capsule (20 mg total) by mouth daily. 30 capsule 10  . pravastatin (PRAVACHOL) 40 MG tablet Take 40 mg by mouth at bedtime.  0  . silver sulfADIAZINE (SILVADENE) 1 % cream Apply 1 application topically daily. 100 g 1  . zolpidem (AMBIEN) 5 MG tablet Take 1 tablet by mouth at bedtime as needed.  0   No current facility-administered medications for this visit.    Family History  Problem Relation Age of Onset  . Colon cancer Neg Hx   . Cancer Mother     uterine  . Stroke Father 68  . Cancer Sister     pancreatic    ROS:  Pertinent items are noted in HPI.  Otherwise, a comprehensive ROS was negative.  Exam:   BP 136/84 mmHg  Pulse 60  Ht 5\' 3"  (1.6 m)  Wt 138 lb (62.596 kg)  BMI 24.45 kg/m2  LMP 11/08/1985 (Approximate) Height: 5\' 3"  (160 cm) Ht Readings from Last 3 Encounters:  11/24/15  5\' 3"  (1.6 m)  12/25/14 5\' 3"  (1.6 m)  11/19/14 5\' 3"  (1.6 m)    General appearance: alert, cooperative and appears stated age Head: Normocephalic, without obvious abnormality, atraumatic Neck: no adenopathy, supple, symmetrical, trachea midline and thyroid normal to inspection and palpation Lungs: clear to auscultation bilaterally Breasts: normal appearance, no masses or tenderness, on the right.  On the left breast there are surgical and radiation changes without new mass. Heart: regular rate and rhythm Abdomen: soft, non-tender; no masses,  no organomegaly Extremities: extremities normal, atraumatic, no cyanosis or edema Skin: Skin color, texture, turgor normal. No rashes or lesions Lymph nodes: Cervical, supraclavicular, and axillary nodes normal. No abnormal inguinal nodes palpated Neurologic: Grossly normal   Pelvic: External genitalia:  no lesions              Urethra:  normal appearing urethra with no masses,  tenderness or lesions              Bartholin's and Skene's: normal                 Vagina: normal appearing vagina with normal color and discharge, no lesions              Cervix: anteverted              Pap taken: Yes.   Bimanual Exam:  Uterus:  normal size, contour, position, consistency, mobility, non-tender              Adnexa: no mass, fullness, tenderness               Rectovaginal: Confirms               Anus:  normal sphincter tone, no lesions  Chaperone present: yes  A:  Well Woman with normal exam  Postmenopausal part of [facility] study with HRT X 14 years until 2003 S/P left Breast Cancer 2003 treated with chemo and radiation History of hearing loss Barrett's esophagitis & GERD  TIA 10/2014 - now on statins [place]: Pancreatic and UTE cancer   P:   Reviewed health and wellness pertinent to exam  Pap smear as above  Mammogram is due 08/2016  Will get copies of immunizations and BMD  Counseled on breast self exam, mammography screening, adequate intake of calcium and vitamin D, diet and exercise, Kegel's exercises return annually or prn  An After Visit Summary was printed and given to the patient.

## 2015-11-26 LAB — IPS PAP SMEAR ONLY

## 2015-11-26 NOTE — Progress Notes (Signed)
Encounter reviewed by Dr. Brook Amundson C. Silva.  

## 2015-11-28 NOTE — Addendum Note (Signed)
Addended by: Kem Boroughs R on: 11/28/2015 11:34 AM   Modules accepted: Orders

## 2015-11-28 NOTE — Addendum Note (Signed)
Addended by: Antonietta Barcelona on: 11/28/2015 08:03 AM   Modules accepted: Orders

## 2015-12-02 LAB — IPS HPV ON A LIQUID BASED SPECIMEN

## 2015-12-04 ENCOUNTER — Telehealth: Payer: Self-pay | Admitting: Emergency Medicine

## 2015-12-04 NOTE — Telephone Encounter (Signed)
Call to patient and she is notified of message from Kem Boroughs, [place]. Questions answered.  Patient will follow up in one year.  Chong Sicilian does this patient need 08 recall?

## 2015-12-04 NOTE — Telephone Encounter (Signed)
-----   Message from Kem Boroughs, [place] sent at 12/02/2015  4:56 PM EST ----- Please let pt know that pap was abnormal- ASCUS -  so HR HPV was added and it was negative.  This abnormality could have been secondary to atrophy.  She has never had abnormal in the past.

## 2015-12-05 NOTE — Telephone Encounter (Signed)
Yes should be a 08 recall.

## 2015-12-05 NOTE — Telephone Encounter (Signed)
08 recall placed. Encounter closed.

## 2016-01-12 DIAGNOSIS — R918 Other nonspecific abnormal finding of lung field: Secondary | ICD-10-CM | POA: Diagnosis not present

## 2016-03-03 ENCOUNTER — Ambulatory Visit (INDEPENDENT_AMBULATORY_CARE_PROVIDER_SITE_OTHER): Payer: Medicare Other | Admitting: Physician Assistant

## 2016-03-03 VITALS — BP 120/76 | HR 72 | Temp 98.1°F | Resp 17 | Ht 63.0 in | Wt 136.0 lb

## 2016-03-03 DIAGNOSIS — L237 Allergic contact dermatitis due to plants, except food: Secondary | ICD-10-CM

## 2016-03-03 MED ORDER — METHYLPREDNISOLONE ACETATE 80 MG/ML IJ SUSP
80.0000 mg | Freq: Once | INTRAMUSCULAR | Status: AC
  Administered 2016-03-03: 80 mg via INTRAMUSCULAR

## 2016-03-03 NOTE — Progress Notes (Signed)
[facility] [address] [place] [address] [phone]  Date:  03/03/2016   Name:  Kathleen Neal   DOB:  1935/11/19   MRN:  [id number]  PCP:  Jerlyn Ly, MD    Chief Complaint: Insect Bite   History of Present Illness:  This is a 80 y.o. female with PMH GERD, barretts esophagus, hx breast cancer who is presenting with "bug bites all over". Has lesions on her right hand and right forearm and back of her neck. Lesions are pruritic. First noticed 2 days ago. Over the weekend she was doing yard work and cut down some bushes. She is worried she has bed bugs. She has tried applying cortisone, no help.  Dr. Joylene Draft, last saw 2 months ago for CPE. No hx DM or elevated glucose.  Review of Systems:  Review of Systems See HPI  Patient Active Problem List   Diagnosis Date Noted  . GERD 02/06/2008  . BARRETTS ESOPHAGUS 02/06/2008  . ADENOCARCINOMA, BREAST, HX OF 02/06/2008  . HELICOBACTER PYLORI GASTRITIS, HX OF 02/06/2008    Prior to Admission medications   Medication Sig Start Date End Date Taking? Authorizing Provider  aspirin 81 MG tablet Take 81 mg by mouth daily. Takes 3 times a week   Yes Historical Provider, MD  Cholecalciferol (VITAMIN D PO) Take 1,000 Units by mouth daily.    Yes Historical Provider, MD  pravastatin (PRAVACHOL) 40 MG tablet Take 40 mg by mouth at bedtime. 11/15/14  Yes Historical Provider, MD  zolpidem (AMBIEN) 5 MG tablet Take 1 tablet by mouth at bedtime as needed. 10/31/14  Yes Historical Provider, MD  omeprazole (PRILOSEC) 20 MG capsule Take 1 capsule (20 mg total) by mouth daily. Patient not taking: Reported on 03/03/2016 04/13/13   Lafayette Dragon, MD  silver sulfADIAZINE (SILVADENE) 1 % cream Apply 1 application topically daily. Patient not taking: Reported on 03/03/2016 12/25/14   Roselee Culver, MD    Allergies  Allergen Reactions  . Codeine     REACTION: nausea    Past Surgical History  Procedure Laterality Date  .  Left knee  2014    meniscus  . Colonoscopy      last colon 2005  . Polypectomy  2000  . Breast lumpectomy  2003    left    Social History  Substance Use Topics  . Smoking status: Former Smoker -- 1.00 packs/day for 30 years    Quit date: 04/08/2002  . Smokeless tobacco: Former Systems developer    Quit date: 04/04/2002  . Alcohol Use: 4.2 oz/week    7 Glasses of wine per week    Family History  Problem Relation Age of Onset  . Colon cancer Neg Hx   . Cancer Mother     uterine  . Stroke Father 8  . Cancer Sister     pancreatic    Medication list has been reviewed and updated.  Physical Examination:  Physical Exam  Constitutional: She is oriented to person, place, and time. She appears well-developed and well-nourished. No distress.  HENT:  Head: Normocephalic and atraumatic.  Right Ear: Hearing normal.  Left Ear: Hearing normal.  Nose: Nose normal.  Eyes: Conjunctivae and lids are normal. Right eye exhibits no discharge. Left eye exhibits no discharge. No scleral icterus.  Pulmonary/Chest: Effort normal. No respiratory distress.  Musculoskeletal: Normal range of motion.  Neurological: She is alert and oriented to person, place, and time.  Skin: Skin is warm, dry and  intact.  Papular/vesicular erythematous lesions in linear formation on right hand and right forearm. Papular lesions on back of neck.  Psychiatric: She has a normal mood and affect. Her speech is normal and behavior is normal. Thought content normal.   BP 120/76 mmHg  Pulse 72  Temp(Src) 98.1 F (36.7 C) (Oral)  Resp 17  Ht 5\' 3"  (1.6 m)  Wt 136 lb (61.689 kg)  BMI 24.10 kg/m2  SpO2 97%  LMP 11/08/1985 (Approximate)  Assessment and Plan:  1. Poison ivy dermatitis Lesions suggestive of poison ivy. We discussed treatment with injection vs oral prednisone. She opted for the injection at this time. Benadryl at night for sleep/itching. If symptoms worsening again in 3 days after injection wears off, she will  call, and I will call in short course of oral prednisone. - methylPREDNISolone acetate (DEPO-MEDROL) injection 80 mg; Inject 1 mL (80 mg total) into the muscle once.   Benjaman Pott Drenda Freeze, MHS [facility]  03/03/2016

## 2016-03-03 NOTE — Patient Instructions (Addendum)
Can continue topical cortisone as needed. Benadryl at night to help with sleep and itching. Call in 3 days if your symptoms are not improving and I will send in a course of oral prednisone   IF you received an x-ray today, you will receive an invoice from [facility]. Please contact [facility] at [phone] with questions or concerns regarding your invoice.   IF you received labwork today, you will receive an invoice from [employer]. Please contact [facility] at [phone] with questions or concerns regarding your invoice.   Our billing staff will not be able to assist you with questions regarding bills from these companies.  You will be contacted with the lab results as soon as they are available. The fastest way to get your results is to activate your My Chart account. Instructions are located on the last page of this paperwork. If you have not heard from [place] regarding the results in 2 weeks, please contact this office.

## 2016-03-04 ENCOUNTER — Telehealth: Payer: Self-pay | Admitting: *Deleted

## 2016-03-04 ENCOUNTER — Telehealth: Payer: Self-pay | Admitting: Nurse Practitioner

## 2016-03-04 MED ORDER — PREDNISONE 20 MG PO TABS: ORAL_TABLET | ORAL | Status: DC

## 2016-03-04 NOTE — Telephone Encounter (Signed)
Pt.notified

## 2016-03-04 NOTE — Telephone Encounter (Signed)
Meds ordered this encounter  Medications  . predniSONE (DELTASONE) 20 MG tablet    Sig: Take 3 PO QAM x3days, 2 PO QAM x3days, 1 PO QAM x3days    Dispense:  18 tablet    Refill:  0    Order Specific Question:  Supervising Provider    Answer:  DOOLITTLE, ROBERT P [[id number]]    

## 2016-03-04 NOTE — Telephone Encounter (Signed)
Encounter opened in error

## 2016-03-04 NOTE — Telephone Encounter (Signed)
Pt called back and she is no better and would like the prednisone sent in.  Elmyra Ricks notes says she will start her on prednisone.    [phone]

## 2016-11-26 ENCOUNTER — Telehealth: Payer: Self-pay | Admitting: Nurse Practitioner

## 2016-11-26 ENCOUNTER — Ambulatory Visit: Payer: Medicare Other | Admitting: Nurse Practitioner

## 2016-11-26 ENCOUNTER — Encounter: Payer: Self-pay | Admitting: Nurse Practitioner

## 2016-11-26 NOTE — Telephone Encounter (Addendum)
Patient called and cancelled her AEX for today due to the inclement weather.  She also said, "I'll have to call back to reschedule.  My husband recently passed away and I have all that to deal with. (Marital status updated in demographics.)  Please tell Patty I'm sorry I could not keep my appointment today."  Patient in an 08 recall.

## 2016-11-26 NOTE — Progress Notes (Unsigned)
Patient ID: Kathleen Neal, female   DOB: October 08, 1936, 81 y.o.   MRN: [id number]  80 y.o. G0P0000 Married  {Race/ethnicity:17218} Fe here for annual exam.    Patient's last menstrual period was 11/08/1985 (approximate).          Sexually active: {yes no:314532}  The current method of family planning is {contraception:315051}.    Exercising: {yes no:314532}  {types:19826} Smoker:  {YES [id number]  Health Maintenance: Pap:  11/03/12, Negative MMG:  08/2015 per patient Colonoscopy:  10/01/14, polyp benign, no repeat due to age BMD:   08/2015, per patient - normal TDaP:  08/15/12 Shingles: completed Pneumonia: completed Hep C and HIV: Not indicated due to age Labs: PCP takes care of all labs   reports that she quit smoking about 14 years ago. She has a 30.00 pack-year smoking history. She quit smokeless tobacco use about 14 years ago. She reports that she drinks about 4.2 oz of alcohol per week . She reports that she does not use drugs.  Past Medical History:  Diagnosis Date  . Barrett's esophagus 2014  . GERD (gastroesophageal reflux disease)   . Hearing loss    left  . lt breast ca dx'd 2003   chemo/xrt comp/ left    Past Surgical History:  Procedure Laterality Date  . BREAST LUMPECTOMY  2003   left  . COLONOSCOPY     last colon 2005  . Left knee  2014   meniscus  . POLYPECTOMY  2000    Current Outpatient Prescriptions  Medication Sig Dispense Refill  . aspirin 81 MG tablet Take 81 mg by mouth daily. Takes 3 times a week    . Cholecalciferol (VITAMIN D PO) Take 1,000 Units by mouth daily.     . pravastatin (PRAVACHOL) 40 MG tablet Take 40 mg by mouth at bedtime.  0  . predniSONE (DELTASONE) 20 MG tablet Take 3 PO QAM x3days, 2 PO QAM x3days, 1 PO QAM x3days 18 tablet 0  . zolpidem (AMBIEN) 5 MG tablet Take 1 tablet by mouth at bedtime as needed.  0   No current facility-administered medications for this visit.     Family History  Problem Relation Age of Onset  . Colon  cancer Neg Hx   . Cancer Mother     uterine  . Stroke Father 49  . Cancer Sister     pancreatic    ROS:  Pertinent items are noted in HPI.  Otherwise, a comprehensive ROS was negative.  Exam:   LMP 11/08/1985 (Approximate)    Ht Readings from Last 3 Encounters:  03/03/16 5\' 3"  (1.6 m)  11/24/15 5\' 3"  (1.6 m)  12/25/14 5\' 3"  (1.6 m)    General appearance: alert, cooperative and appears stated age Head: Normocephalic, without obvious abnormality, atraumatic Neck: no adenopathy, supple, symmetrical, trachea midline and thyroid {EXAM; THYROID:18604} Lungs: clear to auscultation bilaterally Breasts: {Exam; breast:13139::"normal appearance, no masses or tenderness"} Heart: regular rate and rhythm Abdomen: soft, non-tender; no masses,  no organomegaly Extremities: extremities normal, atraumatic, no cyanosis or edema Skin: Skin color, texture, turgor normal. No rashes or lesions Lymph nodes: Cervical, supraclavicular, and axillary nodes normal. No abnormal inguinal nodes palpated Neurologic: Grossly normal   Pelvic: External genitalia:  no lesions              Urethra:  normal appearing urethra with no masses, tenderness or lesions              Bartholin's and Skene's: normal  Vagina: normal appearing vagina with normal color and discharge, no lesions              Cervix: {exam; cervix:14595}              Pap taken: {yes no:314532} Bimanual Exam:  Uterus:  {exam; uterus:12215}              Adnexa: {exam; adnexa:12223}               Rectovaginal: Confirms               Anus:  normal sphincter tone, no lesions  Chaperone present: ***  A:  Well Woman with normal exam  P:   Reviewed health and wellness pertinent to exam  Pap smear as above  {plan; gyn:5269::"mammogram","pap smear","return annually or prn"}  An After Visit Summary was printed and given to the patient.

## 2016-12-28 ENCOUNTER — Telehealth: Payer: Self-pay | Admitting: *Deleted

## 2016-12-28 NOTE — Telephone Encounter (Signed)
Patient in 08 recall. Please contact patient to schedule AEX/ PAP.

## 2016-12-29 NOTE — Telephone Encounter (Signed)
I have attempted to contact this patient by phone with the following results: left message to return call to [place] at [phone] on answering machine (mobile per [facility]). Advised call is in regards to next appointment. [phone] (Home)

## 2017-01-05 NOTE — Telephone Encounter (Signed)
I have attempted to contact this patient by phone with the following results: left message to return call to [place] at [phone] on answering machine (mobile per [facility]). Advised call is in regards to next appointment. [phone] (Home)

## 2017-01-07 NOTE — Telephone Encounter (Signed)
Patient has not returned call or scheduled annual exam.  Please advise recall.

## 2017-01-12 ENCOUNTER — Encounter: Payer: Self-pay | Admitting: *Deleted

## 2017-01-12 NOTE — Telephone Encounter (Signed)
Letter sent - removed from recall -eh 

## 2017-01-12 NOTE — Telephone Encounter (Signed)
OK to send letter and removal from recall.  I can sign letter.

## 2017-01-17 ENCOUNTER — Telehealth: Payer: Self-pay | Admitting: Nurse Practitioner

## 2017-01-17 ENCOUNTER — Ambulatory Visit: Payer: Self-pay | Admitting: Obstetrics and Gynecology

## 2017-01-17 NOTE — Telephone Encounter (Signed)
Spoke with patient. Patient states she experienced some vaginal bleeding yesterday, 3/11. Patient reports as spotting, not bleeding today, but would like to have it checked out. Patient states she has not had intercourse, reports no pain. Patient states she has a [place] toilet that uses water to clean vaginal area, but has used this many times before. Patient reports she has history of BRCA 18 years ago and does not wish to wait. Recommended OV with physician for further evaluation, patient states she can't come in until after 1pm. Patient scheduled for 1:15pm today with Dr. Quincy Simmonds. Patient verbalizes understanding and is agreeable. Last OV 11/24/15.  Routing to provider for final review. Patient is agreeable to disposition. Will close encounter.  Cc: Kem Boroughs, NP

## 2017-01-17 NOTE — Telephone Encounter (Signed)
Spoke with patient. Advised patient office will be closing early d/t weather, can move appointment up earlier. Patient states she was just planning on returning call to reschedule d/t icy roads in her area. Patient rescheduled to 01/18/17 at 3pm with Dr. Talbert Nan. Patient states she will return call on 3/13 if weather is a concern. Patient verbalizes understanding and is agreeable.  Routing to provider for final review. Patient is agreeable to disposition. Will close encounter.  Cc: Dr. Talbert Nan, Kem Boroughs, NP

## 2017-01-17 NOTE — Telephone Encounter (Signed)
Patient is having vaginal bleeding at 34 and is very concern. She would like to come in this afternoon.

## 2017-01-18 ENCOUNTER — Telehealth: Payer: Self-pay | Admitting: Obstetrics and Gynecology

## 2017-01-18 ENCOUNTER — Encounter: Payer: Self-pay | Admitting: Obstetrics and Gynecology

## 2017-01-18 ENCOUNTER — Ambulatory Visit (INDEPENDENT_AMBULATORY_CARE_PROVIDER_SITE_OTHER): Payer: Medicare Other | Admitting: Obstetrics and Gynecology

## 2017-01-18 VITALS — BP 122/60 | HR 76 | Resp 14 | Ht 63.0 in | Wt 137.4 lb

## 2017-01-18 DIAGNOSIS — Z124 Encounter for screening for malignant neoplasm of cervix: Secondary | ICD-10-CM

## 2017-01-18 DIAGNOSIS — N950 Postmenopausal bleeding: Secondary | ICD-10-CM | POA: Diagnosis not present

## 2017-01-18 NOTE — Patient Instructions (Signed)

## 2017-01-18 NOTE — Progress Notes (Signed)
GYNECOLOGY  VISIT   HPI: 81 y.o.   Widowed  Caucasian  female   Kathleen Neal with Patient's last menstrual period was 11/08/1985 (approximate).   here for post-menopausal bleeding. Patient states spotting started 2-3 days ago.  Small amount of blood on her underwear. She does leak a little urine. Sure the blood was coming from her vagina (put her finger in her vagina). She had a "tightness", discomfort in her lower abdomen and back. The abdominal discomfort in the last few days. She has had back pain for about a year, no any different, tolerable. No change in urinary frequency, or urgency, no dysuria.  H/O Breast cancer 17 years ago, was on tamoxifen for 5 years. . Patient states husband passed away earlier this year. He was 96, had multiple medical problems. Married for 50 years. No children. She has some good friends locally.  Last pap in 1/17 was ASCUS, negative hpv  GYNECOLOGIC HISTORY: Patient's last menstrual period was 11/08/1985 (approximate). Contraception:Post-menopause Menopausal hormone therapy: none        OB History    Gravida Para Term Preterm AB Living   0 0 0 0 0 0   SAB TAB Ectopic Multiple Live Births   0 0 0 0 0         Patient Active Problem List   Diagnosis Date Noted  . GERD 02/06/2008  . BARRETTS ESOPHAGUS 02/06/2008  . ADENOCARCINOMA, BREAST, HX OF 02/06/2008  . HELICOBACTER PYLORI GASTRITIS, HX OF 02/06/2008    Past Medical History:  Diagnosis Date  . Barrett's esophagus 2014  . GERD (gastroesophageal reflux disease)   . Hearing loss    left  . lt breast ca dx'd 2003   chemo/xrt comp/ left    Past Surgical History:  Procedure Laterality Date  . BREAST LUMPECTOMY  2003   left  . COLONOSCOPY     last colon 2005  . Left knee  2014   meniscus  . POLYPECTOMY  2000    Current Outpatient Prescriptions  Medication Sig Dispense Refill  . aspirin 81 MG tablet Take 81 mg by mouth daily. Takes 3 times a week    . Cholecalciferol (VITAMIN D PO) Take  1,000 Units by mouth daily.     . pravastatin (PRAVACHOL) 40 MG tablet Take 40 mg by mouth at bedtime.  0  . predniSONE (DELTASONE) 20 MG tablet Take 3 PO QAM x3days, 2 PO QAM x3days, 1 PO QAM x3days 18 tablet 0  . zolpidem (AMBIEN) 5 MG tablet Take 1 tablet by mouth at bedtime as needed.  0   No current facility-administered medications for this visit.      ALLERGIES: Codeine  Family History  Problem Relation Age of Onset  . Colon cancer Neg Hx   . Cancer Mother     uterine  . Stroke Father 58  . Cancer Sister     pancreatic    Social History   Social History  . Marital status: Widowed    Spouse name: N/A  . Number of children: 0  . Years of education: N/A   Occupational History  . Not on file.   Social History Main Topics  . Smoking status: Former Smoker    Packs/day: 1.00    Years: 30.00    Quit date: 04/08/2002  . Smokeless tobacco: Former Systems developer    Quit date: 04/04/2002  . Alcohol use 4.2 oz/week    7 Glasses of wine per week  . Drug use: No  .  Sexual activity: No   Other Topics Concern  . Not on file   Social History Narrative   Lives with husband who was a Teacher, music for [employer]    Review of Systems  Constitutional: Negative.   HENT: Negative.   Eyes: Negative.   Respiratory: Negative.   Cardiovascular: Negative.   Gastrointestinal:       Postmenopause Bleeding  Genitourinary: Negative.   Musculoskeletal: Negative.   Skin: Negative.   Neurological: Negative.   Endo/Heme/Allergies: Negative.   Psychiatric/Behavioral: Negative.     PHYSICAL EXAMINATION:    BP 122/60 (BP Location: Right Arm, Patient Position: Sitting, Cuff Size: Normal)   Pulse 76   Resp 14   Ht 5\' 3"  (1.6 m)   Wt 137 lb 6.4 oz (62.3 kg)   LMP 11/08/1985 (Approximate)   BMI 24.34 kg/m     General appearance: alert, cooperative and appears stated age Abdomen: soft, non-tender; bowel sounds normal; no masses,  no organomegaly  Pelvic: External genitalia:  no lesions               Urethra:  normal appearing urethra with no masses, tenderness or lesions              Bartholins and Skenes: normal                 Vagina: normal appearing atrophic vagina with normal color and discharge, no lesions              Cervix: no lesions              Bimanual Exam:  Uterus:  small, midpostion, mobile uterus, not tender              Adnexa: no mass, fullness, tenderness   The risks of endometrial biopsy were reviewed and a consent was obtained.  A speculum was placed in the vagina and the cervix was cleansed with betadine. A tenaculum was placed on the cervix and the mini-pipelle was placed into the endometrial cavity. The uterus sounded to 5cm. Passed dilator to the same depth. The endometrial biopsy was performed, minimal tissue was obtained. The tenaculum and speculum were removed. There were no complications.                 Chaperone was present for exam.  ASSESSMENT Post menopausal bleeding    PLAN Pap with reflex hpv Endometrial biopsy done (only sounded to 5 cm, will need to correlate with ultrasound findings) Return for an ultrasound, possible sonohysterogram   An After Visit Summary was printed and given to the patient.   CC: Edman Circle, NP

## 2017-01-18 NOTE — Telephone Encounter (Signed)
Called patient to review benefits for a recommended sonohysterogram. Left Voicemail requesting a call back.

## 2017-01-20 LAB — IPS PAP TEST WITH REFLEX TO HPV

## 2017-01-25 ENCOUNTER — Ambulatory Visit (INDEPENDENT_AMBULATORY_CARE_PROVIDER_SITE_OTHER): Payer: Medicare Other | Admitting: Obstetrics and Gynecology

## 2017-01-25 ENCOUNTER — Ambulatory Visit (INDEPENDENT_AMBULATORY_CARE_PROVIDER_SITE_OTHER): Payer: Medicare Other

## 2017-01-25 ENCOUNTER — Encounter: Payer: Self-pay | Admitting: Obstetrics and Gynecology

## 2017-01-25 ENCOUNTER — Other Ambulatory Visit: Payer: Self-pay | Admitting: Obstetrics and Gynecology

## 2017-01-25 VITALS — BP 162/86 | HR 72 | Resp 16 | Wt 137.0 lb

## 2017-01-25 DIAGNOSIS — N950 Postmenopausal bleeding: Secondary | ICD-10-CM

## 2017-01-25 NOTE — Progress Notes (Signed)
GYNECOLOGY  VISIT   HPI: 81 y.o.   Widowed  Caucasian  female   [place] with Patient's last menstrual period was 11/08/1985 (approximate).   here for follow up PMB. Her pap smear was normal, but showed estrogen stimulation, her endometrial biopsy wasn't sufficient (sounded to 5 cm).     She has a h/o breast cancer 17 years ago and was on Tamoxifen x 5 years.   GYNECOLOGIC HISTORY: Patient's last menstrual period was 11/08/1985 (approximate). Contraception:postmenopause  Menopausal hormone therapy: none         OB History    Gravida Para Term Preterm AB Living   0 0 0 0 0 0   SAB TAB Ectopic Multiple Live Births   0 0 0 0 0         Patient Active Problem List   Diagnosis Date Noted  . GERD 02/06/2008  . BARRETTS ESOPHAGUS 02/06/2008  . ADENOCARCINOMA, BREAST, HX OF 02/06/2008  . HELICOBACTER PYLORI GASTRITIS, HX OF 02/06/2008    Past Medical History:  Diagnosis Date  . Barrett's esophagus 2014  . GERD (gastroesophageal reflux disease)   . Hearing loss    left  . lt breast ca dx'd 2003   chemo/xrt comp/ left    Past Surgical History:  Procedure Laterality Date  . BREAST LUMPECTOMY  2003   left  . COLONOSCOPY     last colon 2005  . Left knee  2014   meniscus  . POLYPECTOMY  2000    Current Outpatient Prescriptions  Medication Sig Dispense Refill  . aspirin 81 MG tablet Take 81 mg by mouth daily. Takes 3 times a week    . Cholecalciferol (VITAMIN D PO) Take 1,000 Units by mouth daily.     . pravastatin (PRAVACHOL) 40 MG tablet Take 40 mg by mouth at bedtime.  0  . zolpidem (AMBIEN) 5 MG tablet Take 1 tablet by mouth at bedtime as needed.  0   No current facility-administered medications for this visit.      ALLERGIES: Codeine  Family History  Problem Relation Age of Onset  . Colon cancer Neg Hx   . Cancer Mother     uterine  . Stroke Father 46  . Cancer Sister     pancreatic    Social History   Social History  . Marital status: Widowed    Spouse  name: N/A  . Number of children: 0  . Years of education: N/A   Occupational History  . Not on file.   Social History Main Topics  . Smoking status: Former Smoker    Packs/day: 1.00    Years: 30.00    Quit date: 04/08/2002  . Smokeless tobacco: Former Systems developer    Quit date: 04/04/2002  . Alcohol use 4.2 oz/week    7 Glasses of wine per week  . Drug use: No  . Sexual activity: No   Other Topics Concern  . Not on file   Social History Narrative   Lives with husband who was a Teacher, music for [employer]    Review of Systems  Constitutional: Negative.   HENT: Negative.   Eyes: Negative.   Respiratory: Negative.   Cardiovascular: Negative.   Gastrointestinal: Negative.   Genitourinary: Negative.   Musculoskeletal: Negative.   Skin: Negative.   Neurological: Negative.   Endo/Heme/Allergies: Negative.   Psychiatric/Behavioral: Negative.     PHYSICAL EXAMINATION:    BP (!) 162/86 (BP Location: Right Arm, Patient Position: Sitting, Cuff Size: Normal)  Pulse 72   Resp 16   Wt 137 lb (62.1 kg)   LMP 11/08/1985 (Approximate)   BMI 24.27 kg/m     General appearance: alert, cooperative and appears stated age  Pelvic: External genitalia:  no lesions              Urethra:  normal appearing urethra with no masses, tenderness or lesions              Bartholins and Skenes: normal                 Vagina: normal appearing vagina with normal color and discharge, no lesions              Cervix:no lesions  On ultrasound she had a thickened endometrial stripe of 8 mm with cystic changes. Concerning for a polyp, no blood flow was noted.  Sonohysterogram The procedure and risks of the procedure were reviewed with the patient, consent form was signed. A speculum was placed in the vagina and the cervix was cleansed with betadine. The sonohysterogram catheter was inserted initially just into the cervix, a tenaculum was placed and the catheter was inserted into the uterine cavity. Saline  was infused under direct observation with the ultrasound. It was difficult to distend the uterine cavity (needed to push hard on the syringe), adequate visualization was achieved. The cavity had an unusual, irregular contour, no distinct mass.The catheter was removed.    Chaperone was present for exam.  ASSESSMENT Postmenopausal bleeding, unsatisfactory biopsy (in the cavity), estrogen effect on her pap, sonohysterogram with irregular appearance on the endometrium, no distinct mass.     PLAN Plan: hysteroscopy, dilation and curettage. Reviewed risks, including: bleeding, infection, uterine perforation, fluid overload, need for further sugery ACOG handouts given on hysteroscopy, D&C    An After Visit Summary was printed and given to the patient.  15 minutes face to face time of which over 50% was spent in counseling.

## 2017-01-25 NOTE — Patient Instructions (Signed)
Hysteroscopy  Hysteroscopy is a procedure used for looking inside the womb (uterus). It may be done for various reasons, including:  · To evaluate abnormal bleeding, fibroid (benign, noncancerous) tumors, polyps, scar tissue (adhesions), and possibly cancer of the uterus.  · To look for lumps (tumors) and other uterine growths.  · To look for causes of why a woman cannot get pregnant (infertility), causes of recurrent loss of pregnancy (miscarriages), or a lost intrauterine device (IUD).  · To perform a sterilization by blocking the fallopian tubes from inside the uterus.    In this procedure, a thin, flexible tube with a tiny light and camera on the end of it (hysteroscope) is used to look inside the uterus. A hysteroscopy should be done right after a menstrual period to be sure you are not pregnant.  LET YOUR HEALTH CARE PROVIDER KNOW ABOUT:  · Any allergies you have.  · All medicines you are taking, including vitamins, herbs, eye drops, creams, and over-the-counter medicines.  · Previous problems you or members of your family have had with the use of anesthetics.  · Any blood disorders you have.  · Previous surgeries you have had.  · Medical conditions you have.  RISKS AND COMPLICATIONS  Generally, this is a safe procedure. However, as with any procedure, complications can occur. Possible complications include:  · Putting a hole in the uterus.  · Excessive bleeding.  · Infection.  · Damage to the cervix.  · Injury to other organs.  · Allergic reaction to medicines.  · Too much fluid used in the uterus for the procedure.    BEFORE THE PROCEDURE  · Ask your health care provider about changing or stopping any regular medicines.  · Do not take aspirin or blood thinners for 1 week before the procedure, or as directed by your health care provider. These can cause bleeding.  · If you smoke, do not smoke for 2 weeks before the procedure.  · In some cases, a medicine is placed in the cervix the day before the procedure.  This medicine makes the cervix have a larger opening (dilate). This makes it easier for the instrument to be inserted into the uterus during the procedure.  · Do not eat or drink anything for at least 8 hours before the surgery.  · Arrange for someone to take you home after the procedure.  PROCEDURE  · You may be given a medicine to relax you (sedative). You may also be given one of the following:  ? A medicine that numbs the area around the cervix (local anesthetic).  ? A medicine that makes you sleep through the procedure (general anesthetic).  · The hysteroscope is inserted through the vagina into the uterus. The camera on the hysteroscope sends a picture to a TV screen. This gives the surgeon a good view inside the uterus.  · During the procedure, air or a liquid is put into the uterus, which allows the surgeon to see better.  · Sometimes, tissue is gently scraped from inside the uterus. These tissue samples are sent to a lab for testing.  What to expect after the procedure  · If you had a general anesthetic, you may be groggy for a couple hours after the procedure.  · If you had a local anesthetic, you will be able to go home as soon as you are stable and feel ready.  · You may have some cramping. This normally lasts for a couple days.  · You may   have bleeding, which varies from light spotting for a few days to menstrual-like bleeding for 3-7 days. This is normal.  · If your test results are not back during the visit, make an appointment with your health care provider to find out the results.  This information is not intended to replace advice given to you by your health care provider. Make sure you discuss any questions you have with your health care provider.  Document Released: 01/31/2001 Document Revised: 04/01/2016 Document Reviewed: 05/24/2013  Elsevier Interactive Patient Education © 2017 [employer]

## 2017-01-27 ENCOUNTER — Telehealth: Payer: Self-pay | Admitting: Obstetrics and Gynecology

## 2017-01-27 NOTE — Telephone Encounter (Signed)
Spoke with patient regarding benefit recommended surgery. Patient understood and agreeable to information presented. Patient ready to schedule. Patient is aware of the cancellation policy. Patient aware this is professional benefit only. Patient aware will be contacted by hospital for separate benefits. Forwarding to nurse supervisor for scheduling.  Routing to [employer]

## 2017-01-27 NOTE — Telephone Encounter (Signed)
Ultrasound appointment was completed on 01/25/17

## 2017-01-31 NOTE — Telephone Encounter (Signed)
Follow-up call to patient. Previous message left by patient was that she was unavailable to schedule until 02-14-17. Left message for patient to call back.

## 2017-02-07 NOTE — Telephone Encounter (Signed)
Call back to patient. Advised surgery is scheduled for 02-14-17 at 0930 at [facility] at 0800. Surgery instruction sheet reviewed and printed copy will be mailed to patient.   Routing to provider for final review. Patient agreeable to disposition. Will close encounter.

## 2017-02-07 NOTE — H&P (Signed)
GYNECOLOGY  VISIT   HPI: 81 y.o.   Widowed  Caucasian  female   [place] with Patient's last menstrual period was 11/08/1985 (approximate).   here for follow up PMB. Her pap smear was normal, but showed estrogen stimulation, her endometrial biopsy wasn't sufficient (sounded to 5 cm).     She has a h/o breast cancer 17 years ago and was on Tamoxifen x 5 years.   GYNECOLOGIC HISTORY: Patient's last menstrual period was 11/08/1985 (approximate). Contraception:postmenopause  Menopausal hormone therapy: none                 OB History    Gravida Para Term Preterm AB Living   0 0 0 0 0 0   SAB TAB Ectopic Multiple Live Births   0 0 0 0 0             Patient Active Problem List   Diagnosis Date Noted  . GERD 02/06/2008  . BARRETTS ESOPHAGUS 02/06/2008  . ADENOCARCINOMA, BREAST, HX OF 02/06/2008  . HELICOBACTER PYLORI GASTRITIS, HX OF 02/06/2008        Past Medical History:  Diagnosis Date  . Barrett's esophagus 2014  . GERD (gastroesophageal reflux disease)   . Hearing loss    left  . lt breast ca dx'd 2003   chemo/xrt comp/ left         Past Surgical History:  Procedure Laterality Date  . BREAST LUMPECTOMY  2003   left  . COLONOSCOPY     last colon 2005  . Left knee  2014   meniscus  . POLYPECTOMY  2000          Current Outpatient Prescriptions  Medication Sig Dispense Refill  . aspirin 81 MG tablet Take 81 mg by mouth daily. Takes 3 times a week    . Cholecalciferol (VITAMIN D PO) Take 1,000 Units by mouth daily.     . pravastatin (PRAVACHOL) 40 MG tablet Take 40 mg by mouth at bedtime.  0  . zolpidem (AMBIEN) 5 MG tablet Take 1 tablet by mouth at bedtime as needed.  0   No current facility-administered medications for this visit.      ALLERGIES: Codeine        Family History  Problem Relation Age of Onset  . Colon cancer Neg Hx   . Cancer Mother     uterine  . Stroke Father 43  . Cancer Sister      pancreatic    Social History        Social History  . Marital status: Widowed    Spouse name: N/A  . Number of children: 0  . Years of education: N/A      Occupational History  . Not on file.        Social History Main Topics  . Smoking status: Former Smoker    Packs/day: 1.00    Years: 30.00    Quit date: 04/08/2002  . Smokeless tobacco: Former Systems developer    Quit date: 04/04/2002  . Alcohol use 4.2 oz/week    7 Glasses of wine per week  . Drug use: No  . Sexual activity: No       Other Topics Concern  . Not on file      Social History Narrative   Lives with husband who was a Teacher, music for [employer]    Review of Systems  Constitutional: Negative.   HENT: Negative.   Eyes: Negative.  Respiratory: Negative.   Cardiovascular: Negative.   Gastrointestinal: Negative.   Genitourinary: Negative.   Musculoskeletal: Negative.   Skin: Negative.   Neurological: Negative.   Endo/Heme/Allergies: Negative.   Psychiatric/Behavioral: Negative.     PHYSICAL EXAMINATION:    BP (!) 162/86 (BP Location: Right Arm, Patient Position: Sitting, Cuff Size: Normal)   Pulse 72   Resp 16   Wt 137 lb (62.1 kg)   LMP 11/08/1985 (Approximate)   BMI 24.27 kg/m     General appearance: alert, cooperative and appears stated age  Pelvic: External genitalia:  no lesions              Urethra:  normal appearing urethra with no masses, tenderness or lesions              Bartholins and Skenes: normal                 Vagina: normal appearing vagina with normal color and discharge, no lesions              Cervix:no lesions  On ultrasound she had a thickened endometrial stripe of 8 mm with cystic changes. Concerning for a polyp, no blood flow was noted.  Sonohysterogram The procedure and risks of the procedure were reviewed with the patient, consent form was signed. A speculum was placed in the vagina and the cervix was cleansed with betadine. The sonohysterogram  catheter was inserted initially just into the cervix, a tenaculum was placed and the catheter was inserted into the uterine cavity. Saline was infused under direct observation with the ultrasound. It was difficult to distend the uterine cavity (needed to push hard on the syringe), adequate visualization was achieved. The cavity had an unusual, irregular contour, no distinct mass.The catheter was removed.    Chaperone was present for exam.  ASSESSMENT Postmenopausal bleeding, unsatisfactory biopsy (in the cavity), estrogen effect on her pap, sonohysterogram with irregular appearance on the endometrium, no distinct mass.     PLAN Plan: hysteroscopy, dilation and curettage. Reviewed risks, including: bleeding, infection, uterine perforation, fluid overload, need for further sugery ACOG handouts given on hysteroscopy, D&C    An After Visit Summary was printed and given to the patient.  15 minutes face to face time of which over 50% was spent in counseling.

## 2017-02-07 NOTE — Telephone Encounter (Signed)
Call from patient. Due to change of travel plans, she is available for procedure on 02-14-17 and would like to proceed then if possible. Advised will need to check with hospital and call her back.

## 2017-02-11 ENCOUNTER — Other Ambulatory Visit: Payer: Self-pay

## 2017-02-11 ENCOUNTER — Encounter (HOSPITAL_COMMUNITY): Payer: Self-pay

## 2017-02-11 ENCOUNTER — Encounter (HOSPITAL_COMMUNITY)
Admission: RE | Admit: 2017-02-11 | Discharge: 2017-02-11 | Disposition: A | Discharge status: Home / Self Care | Payer: Medicare Other | Source: Ambulatory Visit | Attending: Obstetrics and Gynecology | Admitting: Obstetrics and Gynecology

## 2017-02-11 DIAGNOSIS — Z853 Personal history of malignant neoplasm of breast: Secondary | ICD-10-CM | POA: Diagnosis not present

## 2017-02-11 DIAGNOSIS — Z7982 Long term (current) use of aspirin: Secondary | ICD-10-CM | POA: Diagnosis not present

## 2017-02-11 DIAGNOSIS — Z79899 Other long term (current) drug therapy: Secondary | ICD-10-CM | POA: Diagnosis not present

## 2017-02-11 DIAGNOSIS — N950 Postmenopausal bleeding: Secondary | ICD-10-CM | POA: Diagnosis present

## 2017-02-11 DIAGNOSIS — H9192 Unspecified hearing loss, left ear: Secondary | ICD-10-CM | POA: Diagnosis not present

## 2017-02-11 DIAGNOSIS — N858 Other specified noninflammatory disorders of uterus: Secondary | ICD-10-CM | POA: Diagnosis not present

## 2017-02-11 DIAGNOSIS — Z9221 Personal history of antineoplastic chemotherapy: Secondary | ICD-10-CM | POA: Diagnosis not present

## 2017-02-11 DIAGNOSIS — Z87891 Personal history of nicotine dependence: Secondary | ICD-10-CM | POA: Diagnosis not present

## 2017-02-11 HISTORY — DX: Headache: R51

## 2017-02-11 HISTORY — DX: Polyneuropathy, unspecified: G62.9

## 2017-02-11 HISTORY — DX: Hyperlipidemia, unspecified: E78.5

## 2017-02-11 HISTORY — DX: Headache, unspecified: R51.9

## 2017-02-11 LAB — COMPREHENSIVE METABOLIC PANEL
ALBUMIN: 4.2 g/dL (ref 3.5–5.0)
ALK PHOS: 41 U/L (ref 38–126)
ALT: 23 U/L (ref 14–54)
ANION GAP: 7 (ref 5–15)
AST: 28 U/L (ref 15–41)
BILIRUBIN TOTAL: 0.6 mg/dL (ref 0.3–1.2)
BUN: 18 mg/dL (ref 6–20)
CALCIUM: 9.3 mg/dL (ref 8.9–10.3)
CO2: 28 mmol/L (ref 22–32)
Chloride: 101 mmol/L (ref 101–111)
Creatinine, Ser: 0.85 mg/dL (ref 0.44–1.00)
GLUCOSE: 102 mg/dL — AB (ref 65–99)
POTASSIUM: 4.2 mmol/L (ref 3.5–5.1)
Sodium: 136 mmol/L (ref 135–145)
Total Protein: 7.1 g/dL (ref 6.5–8.1)

## 2017-02-11 LAB — CBC
HEMATOCRIT: 40.3 % (ref 36.0–46.0)
Hemoglobin: 13 g/dL (ref 12.0–15.0)
MCH: 31.3 pg (ref 26.0–34.0)
MCHC: 32.3 g/dL (ref 30.0–36.0)
MCV: 96.9 fL (ref 78.0–100.0)
Platelets: 392 10*3/uL (ref 150–400)
RBC: 4.16 MIL/uL (ref 3.87–5.11)
RDW: 14.8 % (ref 11.5–15.5)
WBC: 7.1 10*3/uL (ref 4.0–10.5)

## 2017-02-11 NOTE — Pre-Procedure Instructions (Signed)
EKG REVIEWED BY DR. Hayward

## 2017-02-11 NOTE — Patient Instructions (Addendum)
Your procedure is scheduled on: Monday, 4/9  Enter through the Main Entrance of [facility] at: 8:15 am   Pick up the phone at the desk and dial [phone].  Call this number if you have problems the morning of surgery: [phone].  Remember: Do NOT eat or drink (including water) after midnight Sunday, 4/8  Take these medicines the morning of surgery with a SIP OF WATER:  None  Do NOT wear jewelry (body piercing), metal hair clips/bobby pins, make-up, or nail polish. Do NOT wear lotions, powders, or perfumes.  You may wear deoderant. Do NOT shave for 48 hours prior to surgery. Do NOT bring valuables to the hospital. Contacts, dentures, or bridgework may not be worn into surgery.   Have a responsible adult drive you home and stay with you for 24 hours after your procedure

## 2017-02-14 ENCOUNTER — Ambulatory Visit (HOSPITAL_COMMUNITY): Payer: Medicare Other | Admitting: Anesthesiology

## 2017-02-14 ENCOUNTER — Encounter (HOSPITAL_COMMUNITY)
Admission: RE | Disposition: A | Discharge status: Home / Self Care | Payer: Self-pay | Source: Ambulatory Visit | Attending: Obstetrics and Gynecology

## 2017-02-14 ENCOUNTER — Ambulatory Visit (HOSPITAL_COMMUNITY)
Admission: RE | Admit: 2017-02-14 | Discharge: 2017-02-14 | Disposition: A | Discharge status: Home / Self Care | Payer: Medicare Other | Source: Ambulatory Visit | Attending: Obstetrics and Gynecology | Admitting: Obstetrics and Gynecology

## 2017-02-14 ENCOUNTER — Encounter (HOSPITAL_COMMUNITY): Payer: Self-pay | Admitting: *Deleted

## 2017-02-14 DIAGNOSIS — Z9221 Personal history of antineoplastic chemotherapy: Secondary | ICD-10-CM | POA: Diagnosis not present

## 2017-02-14 DIAGNOSIS — Z87891 Personal history of nicotine dependence: Secondary | ICD-10-CM | POA: Insufficient documentation

## 2017-02-14 DIAGNOSIS — N858 Other specified noninflammatory disorders of uterus: Secondary | ICD-10-CM | POA: Insufficient documentation

## 2017-02-14 DIAGNOSIS — Z853 Personal history of malignant neoplasm of breast: Secondary | ICD-10-CM | POA: Diagnosis not present

## 2017-02-14 DIAGNOSIS — N950 Postmenopausal bleeding: Secondary | ICD-10-CM

## 2017-02-14 DIAGNOSIS — Z79899 Other long term (current) drug therapy: Secondary | ICD-10-CM | POA: Insufficient documentation

## 2017-02-14 DIAGNOSIS — Z7982 Long term (current) use of aspirin: Secondary | ICD-10-CM | POA: Insufficient documentation

## 2017-02-14 DIAGNOSIS — H9192 Unspecified hearing loss, left ear: Secondary | ICD-10-CM | POA: Insufficient documentation

## 2017-02-14 HISTORY — PX: DILATATION & CURETTAGE/HYSTEROSCOPY WITH MYOSURE: SHX6511

## 2017-02-14 HISTORY — DX: Unspecified cataract: H26.9

## 2017-02-14 SURGERY — DILATATION & CURETTAGE/HYSTEROSCOPY WITH MYOSURE
Anesthesia: General | Site: Vagina

## 2017-02-14 MED ORDER — FENTANYL CITRATE (PF) 100 MCG/2ML IJ SOLN
INTRAMUSCULAR | Status: AC
  Filled 2017-02-14: qty 2

## 2017-02-14 MED ORDER — PROPOFOL 10 MG/ML IV BOLUS
INTRAVENOUS | Status: DC | PRN
  Administered 2017-02-14: 100 mg via INTRAVENOUS

## 2017-02-14 MED ORDER — GLYCOPYRROLATE 0.2 MG/ML IJ SOLN
INTRAMUSCULAR | Status: AC
  Filled 2017-02-14: qty 1

## 2017-02-14 MED ORDER — PROMETHAZINE HCL 25 MG/ML IJ SOLN: 6.2500 mg | INTRAMUSCULAR | Status: DC | PRN

## 2017-02-14 MED ORDER — FENTANYL CITRATE (PF) 100 MCG/2ML IJ SOLN
25.0000 ug | INTRAMUSCULAR | Status: DC | PRN
  Administered 2017-02-14: 25 ug via INTRAVENOUS

## 2017-02-14 MED ORDER — DEXAMETHASONE SODIUM PHOSPHATE 10 MG/ML IJ SOLN
INTRAMUSCULAR | Status: AC
  Filled 2017-02-14: qty 1

## 2017-02-14 MED ORDER — GLYCOPYRROLATE 0.2 MG/ML IJ SOLN
INTRAMUSCULAR | Status: DC | PRN
  Administered 2017-02-14: 0.1 mg via INTRAVENOUS

## 2017-02-14 MED ORDER — PROPOFOL 10 MG/ML IV BOLUS
INTRAVENOUS | Status: AC
  Filled 2017-02-14: qty 20

## 2017-02-14 MED ORDER — LACTATED RINGERS IV SOLN
INTRAVENOUS | Status: DC
  Administered 2017-02-14 (×2): via INTRAVENOUS

## 2017-02-14 MED ORDER — FENTANYL CITRATE (PF) 100 MCG/2ML IJ SOLN
INTRAMUSCULAR | Status: DC | PRN
  Administered 2017-02-14 (×4): 25 ug via INTRAVENOUS

## 2017-02-14 MED ORDER — KETOROLAC TROMETHAMINE 30 MG/ML IJ SOLN: 30.0000 mg | Freq: Once | INTRAMUSCULAR | Status: DC | PRN

## 2017-02-14 MED ORDER — KETOROLAC TROMETHAMINE 30 MG/ML IJ SOLN
INTRAMUSCULAR | Status: AC
  Administered 2017-02-14: 15 mg via INTRAVENOUS
  Filled 2017-02-14: qty 1

## 2017-02-14 MED ORDER — LIDOCAINE HCL (CARDIAC) 20 MG/ML IV SOLN
INTRAVENOUS | Status: DC | PRN
  Administered 2017-02-14: 40 mg via INTRAVENOUS

## 2017-02-14 MED ORDER — ONDANSETRON HCL 4 MG/2ML IJ SOLN
INTRAMUSCULAR | Status: DC | PRN
  Administered 2017-02-14 (×2): 2 mg via INTRAVENOUS

## 2017-02-14 MED ORDER — KETOROLAC TROMETHAMINE 30 MG/ML IJ SOLN
15.0000 mg | Freq: Once | INTRAMUSCULAR | Status: DC | PRN
  Administered 2017-02-14: 15 mg via INTRAVENOUS

## 2017-02-14 MED ORDER — LABETALOL HCL 5 MG/ML IV SOLN
INTRAVENOUS | Status: DC | PRN
  Administered 2017-02-14: 5 mg via INTRAVENOUS

## 2017-02-14 MED ORDER — LIDOCAINE HCL (CARDIAC) 20 MG/ML IV SOLN
INTRAVENOUS | Status: AC
  Filled 2017-02-14: qty 5

## 2017-02-14 MED ORDER — ONDANSETRON HCL 4 MG/2ML IJ SOLN
INTRAMUSCULAR | Status: AC
  Filled 2017-02-14: qty 2

## 2017-02-14 SURGICAL SUPPLY — 19 items
CANISTER SUCT 3000ML PPV (MISCELLANEOUS) ×3 IMPLANT
CATH ROBINSON RED A/P 16FR (CATHETERS) ×3 IMPLANT
CLOTH BEACON ORANGE TIMEOUT ST (SAFETY) ×3 IMPLANT
CONTAINER PREFILL 10% NBF 60ML (FORM) ×6 IMPLANT
DEVICE MYOSURE LITE (MISCELLANEOUS) IMPLANT
DEVICE MYOSURE REACH (MISCELLANEOUS) ×2 IMPLANT
FILTER ARTHROSCOPY CONVERTOR (FILTER) ×3 IMPLANT
GLOVE BIO SURGEON STRL SZ 6.5 (GLOVE) ×2 IMPLANT
GLOVE BIO SURGEONS STRL SZ 6.5 (GLOVE) ×1
GLOVE BIOGEL PI IND STRL 7.0 (GLOVE) ×2 IMPLANT
GLOVE BIOGEL PI INDICATOR 7.0 (GLOVE) ×4
GOWN STRL REUS W/TWL LRG LVL3 (GOWN DISPOSABLE) ×6 IMPLANT
PACK VAGINAL MINOR WOMEN LF (CUSTOM PROCEDURE TRAY) ×3 IMPLANT
PAD OB MATERNITY 4.3X12.25 (PERSONAL CARE ITEMS) ×3 IMPLANT
SEAL ROD LENS SCOPE MYOSURE (ABLATOR) ×3 IMPLANT
SYR 20CC LL (SYRINGE) IMPLANT
TOWEL OR 17X24 6PK STRL BLUE (TOWEL DISPOSABLE) ×6 IMPLANT
TUBING AQUILEX INFLOW (TUBING) ×3 IMPLANT
TUBING AQUILEX OUTFLOW (TUBING) ×3 IMPLANT

## 2017-02-14 NOTE — Interval H&P Note (Signed)
History and Physical Interval Note:  02/14/2017 9:54 AM  Kathleen Neal  has presented today for surgery, with the diagnosis of PMB  The various methods of treatment have been discussed with the patient and family. After consideration of risks, benefits and other options for treatment, the patient has consented to  Procedure(s) with comments: [place] (N/A) - follow first case as a surgical intervention .  The patient's history has been reviewed, patient examined, no change in status, stable for surgery.  I have reviewed the patient's chart and labs.  Questions were answered to the patient's satisfaction.     [name]

## 2017-02-14 NOTE — Anesthesia Procedure Notes (Signed)
Procedure Name: LMA Insertion Date/Time: 02/14/2017 10:09 AM Performed by: Suzette Battiest Pre-anesthesia Checklist: Patient being monitored, Emergency Drugs available, Suction available, Timeout performed and Patient identified Patient Re-evaluated:Patient Re-evaluated prior to inductionOxygen Delivery Method: Circle system utilized Preoxygenation: Pre-oxygenation with 100% oxygen Intubation Type: IV induction Ventilation: Mask ventilation without difficulty LMA: LMA inserted LMA Size: 4.0 Number of attempts: 1 Tube secured with: Tape

## 2017-02-14 NOTE — Transfer of Care (Signed)
Immediate Anesthesia Transfer of Care Note  Patient: Kathleen Neal  Procedure(s) Performed: Procedure(s) with comments: DILATATION & CURETTAGE/HYSTEROSCOPY WITH MYOSURE (N/A) - follow first case  Patient Location: PACU  Anesthesia Type:General  Level of Consciousness: awake, alert  and oriented  Airway & Oxygen Therapy: Patient Spontanous Breathing and Patient connected to nasal cannula oxygen  Post-op Assessment: Report given to RN, Post -op Vital signs reviewed and stable and Patient moving all extremities X 4  Post vital signs: Reviewed and stable  Last Vitals:  Vitals:   02/14/17 0814  Pulse: 69  Resp: 16  Temp: 36.3 C    Last Pain:  Vitals:   02/14/17 0814  TempSrc: Oral      Patients Stated Pain Goal: 3 (96/75/91 6384)  Complications: No apparent anesthesia complications

## 2017-02-14 NOTE — Anesthesia Preprocedure Evaluation (Addendum)
Anesthesia Evaluation  Patient identified by MRN, date of birth, ID band Patient awake    Reviewed: Allergy & Precautions, NPO status , Patient's Chart, lab work & pertinent test results  Airway Mallampati: II  TM Distance: >3 FB Neck ROM: Full    Dental  (+) Dental Advisory Given   Pulmonary former smoker,    breath sounds clear to auscultation       Cardiovascular negative cardio ROS   Rhythm:Regular Rate:Normal     Neuro/Psych negative neurological ROS     GI/Hepatic Neg liver ROS, GERD  ,  Endo/Other  negative endocrine ROS  Renal/GU negative Renal ROS     Musculoskeletal negative musculoskeletal ROS (+)   Abdominal   Peds  Hematology negative hematology ROS (+)   Anesthesia Other Findings   Reproductive/Obstetrics                            Lab Results  Component Value Date   WBC 7.1 02/11/2017   HGB 13.0 02/11/2017   HCT 40.3 02/11/2017   MCV 96.9 02/11/2017   PLT 392 02/11/2017   Lab Results  Component Value Date   CREATININE 0.85 02/11/2017   BUN 18 02/11/2017   NA 136 02/11/2017   K 4.2 02/11/2017   CL 101 02/11/2017   CO2 28 02/11/2017    Anesthesia Physical Anesthesia Plan  ASA: II  Anesthesia Plan: General   Post-op Pain Management:    Induction: Intravenous  Airway Management Planned: LMA  Additional Equipment:   Intra-op Plan:   Post-operative Plan: Extubation in OR  Informed Consent: I have reviewed the patients History and Physical, chart, labs and discussed the procedure including the risks, benefits and alternatives for the proposed anesthesia with the patient or authorized representative who has indicated his/her understanding and acceptance.   Dental advisory given  Plan Discussed with: CRNA  Anesthesia Plan Comments:         Anesthesia Quick Evaluation

## 2017-02-14 NOTE — Discharge Instructions (Signed)
DISCHARGE INSTRUCTIONS: HYSTEROSCOPY  °The following instructions have been prepared to help you care for yourself upon your return home. ° ° ° °Personal hygiene: °• Use sanitary pads for vaginal drainage, not tampons. °• Shower the day after your procedure. °• NO tub baths, pools or Jacuzzis for 2-3 weeks. °• Wipe front to back after using the bathroom. ° °Activity and limitations: °• Do NOT drive or operate any equipment for 24 hours. The effects of anesthesia are still present °and drowsiness may result. °• Do NOT rest in bed all day. °• Walking is encouraged. °• Walk up and down stairs slowly. °• You may resume your normal activity in one to two days or as indicated by your physician. °Sexual activity: NO intercourse for at least 2 weeks after the procedure, or as indicated by your °Doctor. ° °Diet: Eat a light meal as desired this evening. You may resume your usual diet tomorrow. ° °Return to Work: You may resume your work activities in one to two days or as indicated by your °Doctor. ° °What to expect after your surgery: Expect to have vaginal bleeding/discharge for 2-3 days and °spotting for up to 10 days. It is not unusual to have soreness for up to 1-2 weeks. You may have a °slight burning sensation when you urinate for the first day. Mild cramps may continue for a couple of °days. You may have a regular period in 2-6 weeks. ° °Call your doctor for any of the following: °• Excessive vaginal bleeding or clotting, saturating and changing one pad every hour. °• Inability to urinate 6 hours after discharge from hospital. °• Pain not relieved by pain medication. °• Fever of 100.4° F or greater. °• Unusual vaginal discharge or odor. ° °Return to office _________________Call for an appointment ___________________ °Patient’s signature: ______________________ °Nurse’s signature ________________________ ° °Post Anesthesia Care Unit [phone] ° ° ° ° °Post Anesthesia Home Care Instructions ° °Activity: °Get plenty  of rest for the remainder of the day. A responsible individual must stay with you for 24 hours following the procedure.  °For the next 24 hours, DO NOT: °-Drive a car °-Operate machinery °-Drink alcoholic beverages °-Take any medication unless instructed by your physician °-Make any legal decisions or sign important papers. ° °Meals: °Start with liquid foods such as gelatin or soup. Progress to regular foods as tolerated. Avoid greasy, spicy, heavy foods. If nausea and/or vomiting occur, drink only clear liquids until the nausea and/or vomiting subsides. Call your physician if vomiting continues. ° °Special Instructions/Symptoms: °Your throat may feel dry or sore from the anesthesia or the breathing tube placed in your throat during surgery. If this causes discomfort, gargle with warm salt water. The discomfort should disappear within 24 hours. ° °If you had a scopolamine patch placed behind your ear for the management of post- operative nausea and/or vomiting: ° °1. The medication in the patch is effective for 72 hours, after which it should be removed.  Wrap patch in a tissue and discard in the trash. Wash hands thoroughly with soap and water. °2. You may remove the patch earlier than 72 hours if you experience unpleasant side effects which may include dry mouth, dizziness or visual disturbances. °3. Avoid touching the patch. Wash your hands with soap and water after contact with the patch. °  ° °

## 2017-02-14 NOTE — Op Note (Signed)
Preoperative Diagnosis: postmenopausal bleeding  Postoperative Diagnosis: same  Procedure: Hysteroscopy, dilation and curettage  Surgeon: Dr Sumner Boast  Assistants: None  Anesthesia: General via LMA  EBL: 5 cc  Fluids: 1,600  Fluid deficit: 400 cc  Urine output: not recorded  Indications for surgery: The patient is a 81 yo female, who presented with postmenopausal bleeding. Work up included an endometrial biopsy that wasn't sufficient, a pap that was normal, but showed estrogen stimulation, a thickened endometrium on ultrasound and a sonohysterogram with an unusual irregular contour.  The risks of the surgery were reviewed with the patient and the consent form was signed prior to her surgery.  Findings: EUA: normal sized uterus, no adnexal masses. Hysteroscopy: the endometrial cavity had what appeared to be some scaring by the ostia and a few areas of thickening. There was a false passage created above the endometrial cavity on the left, it was not full thickness.   Specimens: endometrial curettings   Procedure: The patient was taken to the operating room with an IV in place. She was placed in the dorsal lithotomy position and anesthesia was administered. She was prepped and draped in the usual sterile fashion for a vaginal procedure. She voided on the way to the OR. A weighted speculum was placed in the vagina and a single tooth tenaculum was placed on the anterior lip of the cervix. The cervix was dilated to a #7 hagar dilator. The myosure hysteroscope was inserted into the uterine cavity. With continuous infusion of normal saline, the uterine cavity was visualized with the above findings. The myosure reach was used to resect the areas of abnormality under direct visualization. The myosure was then removed. The cavity was then curetted with the small sharp curette, taking care to avoid the area of the false passage. The cavity had the characteristically gritty texture at the end of  the procedure. The curette and the single tooth tenaculum were removed. The hysteroscope was reinserted into the endometrial cavity, there was a small amount of further endometrium that was removed. The speculum was removed. The patients perineum was cleansed of betadine and she was taken out of the dorsal lithotomy position.  Upon awakening the LMA was removed and the patient was transferred to the recovery room in stable and awake condition.  The sponge and instrument count were correct. There were no complications.

## 2017-02-14 NOTE — Anesthesia Postprocedure Evaluation (Signed)
Anesthesia Post Note  Patient: Kathleen Neal  Procedure(s) Performed: Procedure(s) (LRB): DILATATION & CURETTAGE/HYSTEROSCOPY WITH MYOSURE (N/A)  Patient location during evaluation: PACU Anesthesia Type: General Level of consciousness: awake and alert Pain management: pain level controlled Vital Signs Assessment: post-procedure vital signs reviewed and stable Respiratory status: spontaneous breathing, nonlabored ventilation, respiratory function stable and patient connected to nasal cannula oxygen Cardiovascular status: blood pressure returned to baseline and stable Postop Assessment: no signs of nausea or vomiting Anesthetic complications: no        Last Vitals:  Vitals:   02/14/17 1215 02/14/17 1246  BP:  (!) 147/65  Pulse: 68 69  Resp: 16 16  Temp:      Last Pain:  Vitals:   02/14/17 1155  TempSrc:   PainSc: 2    Pain Goal: Patients Stated Pain Goal: 3 (02/14/17 0814)               Tiajuana Amass

## 2017-02-15 ENCOUNTER — Encounter (HOSPITAL_COMMUNITY): Payer: Self-pay | Admitting: Obstetrics and Gynecology

## 2017-02-22 ENCOUNTER — Telehealth: Payer: Self-pay | Admitting: Obstetrics and Gynecology

## 2017-02-22 NOTE — Telephone Encounter (Signed)
Patient said she received a message and is returning your call

## 2017-02-23 ENCOUNTER — Ambulatory Visit (INDEPENDENT_AMBULATORY_CARE_PROVIDER_SITE_OTHER): Payer: Medicare Other | Admitting: Obstetrics and Gynecology

## 2017-02-23 ENCOUNTER — Encounter: Payer: Self-pay | Admitting: Obstetrics and Gynecology

## 2017-02-23 VITALS — BP 138/60 | HR 84 | Resp 16 | Wt 137.0 lb

## 2017-02-23 DIAGNOSIS — Z9889 Other specified postprocedural states: Secondary | ICD-10-CM

## 2017-02-23 DIAGNOSIS — L02215 Cutaneous abscess of perineum: Secondary | ICD-10-CM

## 2017-02-23 MED ORDER — DOXYCYCLINE HYCLATE 100 MG PO CAPS: 100.0000 mg | ORAL_CAPSULE | Freq: Two times a day (BID) | ORAL | 0 refills | Status: DC

## 2017-02-23 NOTE — Addendum Note (Signed)
Addended by: Dorothy Spark on: 02/23/2017 11:02 AM   Modules accepted: Orders

## 2017-02-23 NOTE — Telephone Encounter (Signed)
Patient called states she has a rash on her bottom near the incision site that is red and swollen and painful.  She has had it for a couple of days. States she woke up this morning and there is what appears to be a "pus bump" in the middle of the rash.  Wants to speak to a nurse about it.

## 2017-02-23 NOTE — Patient Instructions (Signed)
Skin Abscess A skin abscess is an infected area on or under your skin that contains a collection of pus and other material. An abscess may also be called a furuncle, carbuncle, or boil. An abscess can occur in or on almost any part of your body. Some abscesses break open (rupture) on their own. Most continue to get worse unless they are treated. The infection can spread deeper into the body and eventually into your blood, which can make you feel ill. Treatment usually involves draining the abscess. What are the causes? An abscess occurs when germs, often bacteria, pass through your skin and cause an infection. This may be caused by:  A scrape or cut on your skin.  A puncture wound through your skin, including a needle injection.  Blocked oil or sweat glands.  Blocked and infected hair follicles.  A cyst that forms beneath your skin (sebaceous cyst) and becomes infected. What increases the risk? This condition is more likely to develop in people who:  Have a weak body defense system (immune system).  Have diabetes.  Have dry and irritated skin.  Get frequent injections or use illegal IV drugs.  Have a foreign body in a wound, such as a splinter.  Have problems with their lymph system or veins. What are the signs or symptoms? An abscess may start as a painful, firm bump under the skin. Over time, the abscess may get larger or become softer. Pus may appear at the top of the abscess, causing pressure and pain. It may eventually break through the skin and drain. Other symptoms include:  Redness.  Warmth.  Swelling.  Tenderness.  A sore on the skin. How is this diagnosed? This condition is diagnosed based on your medical history and a physical exam. A sample of pus may be taken from the abscess to find out what is causing the infection and what antibiotics can be used to treat it. You also may have:  Blood tests to look for signs of infection or spread of an infection to your  blood.  Imaging studies such as ultrasound, CT scan, or MRI if the abscess is deep. How is this treated? Small abscesses that drain on their own may not need treatment. Treatment for an abscess that does not rupture on its own may include:  Warm compresses applied to the area several times per day.  Incision and drainage. Your health care provider will make an incision to open the abscess and will remove pus and any foreign body or dead tissue. The incision area may be packed with gauze to keep it open for a few days while it heals.  Antibiotic medicines to treat infection. For a severe abscess, you may first get antibiotics through an IV and then change to oral antibiotics. Follow these instructions at home: Abscess Care   If you have an abscess that has not drained, place a warm, clean, wet washcloth over the abscess several times a day. Do this as told by your health care provider.  Follow instructions from your health care provider about how to take care of your abscess. Make sure you:  Cover the abscess with a bandage (dressing).  Change your dressing or gauze as told by your health care provider.  Wash your hands with soap and water before you change the dressing or gauze. If soap and water are not available, use hand sanitizer.  Check your abscess every day for signs of a worsening infection. Check for:  More redness, swelling, or   pain.  More fluid or blood.  Warmth.  More pus or a bad smell. Medicines   Take over-the-counter and prescription medicines only as told by your health care provider.  If you were prescribed an antibiotic medicine, take it as told by your health care provider. Do not stop taking the antibiotic even if you start to feel better. General instructions   To avoid spreading the infection:  Do not share personal care items, towels, or hot tubs with others.  Avoid making skin contact with other people.  Keep all follow-up visits as told by your  health care provider. This is important. Contact a health care provider if:  You have more redness, swelling, or pain around your abscess.  You have more fluid or blood coming from your abscess.  Your abscess feels warm to the touch.  You have more pus or a bad smell coming from your abscess.  You have a fever.  You have muscle aches.  You have chills or a general ill feeling. Get help right away if:  You have severe pain.  You see red streaks on your skin spreading away from the abscess. This information is not intended to replace advice given to you by your health care provider. Make sure you discuss any questions you have with your health care provider. Document Released: 08/04/2005 Document Revised: 06/20/2016 Document Reviewed: 09/03/2015 Elsevier Interactive Patient Education  2017 [employer]  

## 2017-02-23 NOTE — Telephone Encounter (Signed)
Spoke with patient. Patient states she has a rash that started about 4-5 days ago on behind, where butt meets vagina. Patient states the rash is uncomfortable and she noticed a pus spot that started yesterday in middle of the rash. Patient states she has been applying antibiotic cream with no changes. Patient had D&C on 02/14/17. Patient asking if she should wait until f/u appointment next week? Recommended OV today with Dr. Talbert Nan for further evaluation. Patient scheduled for 4/18 at 10:15am. Patient is agreeable to date and time.  Routing to provider for final review. Patient is agreeable to disposition. Will close encounter.

## 2017-02-23 NOTE — Progress Notes (Signed)
GYNECOLOGY  VISIT   HPI: 81 y.o.   Widowed  Caucasian  female   [place] with Patient's last menstrual period was 11/08/1985 (approximate).   here c/o of a painful, itching rash in the right vaginal area X 7 days. She noticed a tender bump in the vulvar area 2-3 days after surgery. Today she noticed it was draining some pus. Very tender. She tried some bacitracin on it. No fevers, otherwise feels well.  She is 9 days s/p hysteroscopy, D&C, pathology with polypoid endometrium and atrophy. She had some light bleeding after her surgery, stopped. She thinks the pads she was wearing may have irritated her skin. No abdominal pain.   GYNECOLOGIC HISTORY: Patient's last menstrual period was 11/08/1985 (approximate). Contraception:postmenopause  Menopausal hormone therapy: none        OB History    Gravida Para Term Preterm AB Living   0 0 0 0 0 0   SAB TAB Ectopic Multiple Live Births   0 0 0 0 0         Patient Active Problem List   Diagnosis Date Noted  . GERD 02/06/2008  . BARRETTS ESOPHAGUS 02/06/2008  . ADENOCARCINOMA, BREAST, HX OF 02/06/2008  . HELICOBACTER PYLORI GASTRITIS, HX OF 02/06/2008    Past Medical History:  Diagnosis Date  . Barrett's esophagus 2014  . Cataracts, bilateral   . GERD (gastroesophageal reflux disease)    occasional - diet controlled  . Headache    occasional  . Hearing loss    left  . Hyperlipidemia   . lt breast ca dx'd 2003   chemo/xrt comp/ left  . Neuropathy    both feet     Past Surgical History:  Procedure Laterality Date  . BREAST LUMPECTOMY  2003   left - bx  . COLONOSCOPY     last colon 2005  . DILATATION & CURETTAGE/HYSTEROSCOPY WITH MYOSURE N/A 02/14/2017   Procedure: DILATATION & CURETTAGE/HYSTEROSCOPY WITH MYOSURE;  Surgeon: [name], MD;  Location: [facility];  Service: Gynecology;  Laterality: N/A;  follow first case  . Left knee  2014   meniscus  . POLYPECTOMY  2000  . UPPER GI ENDOSCOPY      Current Outpatient  Prescriptions  Medication Sig Dispense Refill  . acetaminophen (TYLENOL) 325 MG tablet Take 325 mg by mouth daily as needed for moderate pain or headache.    Marland Kitchen aspirin 81 MG tablet Take 81 mg by mouth daily.     . Biotin 5000 MCG CAPS Take 5,000 mcg by mouth daily.    . Cholecalciferol (VITAMIN D PO) Take 1 capsule by mouth daily.     . Flaxseed, Linseed, (FLAX SEED OIL) 1000 MG CAPS Take 1,000 mg by mouth daily.    . pravastatin (PRAVACHOL) 40 MG tablet Take 40 mg by mouth at bedtime.  0  . Tetrahydrozoline HCl (VISINE OP) Apply 1 drop to eye daily as needed (dry eyes).    Marland Kitchen zolpidem (AMBIEN) 5 MG tablet Take 0.5 tablets by mouth at bedtime.   0   No current facility-administered medications for this visit.      ALLERGIES: Codeine  Family History  Problem Relation Age of Onset  . Cancer Mother     uterine  . Stroke Father 3  . Cancer Sister     pancreatic  . Colon cancer Neg Hx     Social History   Social History  . Marital status: Widowed    Spouse name: N/A  . Number  of children: 0  . Years of education: N/A   Occupational History  . Not on file.   Social History Main Topics  . Smoking status: Former Smoker    Packs/day: 1.00    Years: 30.00    Quit date: 04/08/2002  . Smokeless tobacco: Never Used  . Alcohol use 8.4 oz/week    14 Glasses of wine per week  . Drug use: No  . Sexual activity: No   Other Topics Concern  . Not on file   Social History Narrative   Lives with husband who was a Teacher, music for [employer]    Review of Systems  Constitutional: Negative.   HENT: Negative.   Eyes: Negative.   Respiratory: Negative.   Cardiovascular: Negative.   Gastrointestinal: Negative.   Genitourinary:       Itching rash in vaginal area    Musculoskeletal: Negative.   Skin: Negative.   Neurological: Negative.   Endo/Heme/Allergies: Negative.   Psychiatric/Behavioral: Negative.     PHYSICAL EXAMINATION:    BP 138/60 (BP Location: Right Arm,  Patient Position: Sitting, Cuff Size: Normal)   Pulse 84   Resp 16   Wt 137 lb (62.1 kg)   LMP 11/08/1985 (Approximate)   BMI 24.27 kg/m     General appearance: alert, cooperative and appears stated age  Pelvic: External genitalia:  On the right upper buttock/lower vulva is a skin abscess, the area of induration extends approximately 4 cm, surrounding erythema is present. With palpation, pinpoint area of drainage, minimal.               Urethra:  normal appearing urethra with no masses, tenderness or lesions              Bartholins and Skenes: normal                   Incision and drainage: The area was cleansed with betadine and injected with 1% lidocaine. An approximately 8 mm incision was made with a # 11 blade, pus drained. A culture was obtained from the cavity of the abscess.  The area was cleansed with warm water.  Chaperone was present for exam.  ASSESSMENT Right upper buttock/lower vulvar abscess with surrounding erythema Post hysteroscopy, D&C with benign pathology, doing well    PLAN I&D of perineal abscess Will treat with antibiotics Tylenol or ibuprofen for pain Discussed sitz baths F/U in 1 day   An After Visit Summary was printed and given to the patient.

## 2017-02-24 ENCOUNTER — Ambulatory Visit (INDEPENDENT_AMBULATORY_CARE_PROVIDER_SITE_OTHER): Payer: Medicare Other | Admitting: Obstetrics and Gynecology

## 2017-02-24 ENCOUNTER — Encounter: Payer: Self-pay | Admitting: Obstetrics and Gynecology

## 2017-02-24 VITALS — BP 114/60 | HR 88 | Resp 18 | Wt 137.0 lb

## 2017-02-24 DIAGNOSIS — L02215 Cutaneous abscess of perineum: Secondary | ICD-10-CM

## 2017-02-24 NOTE — Progress Notes (Signed)
GYNECOLOGY  VISIT   HPI: 81 y.o.   Widowed  Caucasian  female   [place] with Patient's last menstrual period was 11/08/1985 (approximate).   here for  Follow up perineal abscess, she had I&D of a perineal abscess yesterday and was started on antibiotics for a surrounding cellulitis. Pain has improved.   GYNECOLOGIC HISTORY: Patient's last menstrual period was 11/08/1985 (approximate). Contraception:postmenopause  Menopausal hormone therapy: none        OB History    Gravida Para Term Preterm AB Living   0 0 0 0 0 0   SAB TAB Ectopic Multiple Live Births   0 0 0 0 0         Patient Active Problem List   Diagnosis Date Noted  . GERD 02/06/2008  . BARRETTS ESOPHAGUS 02/06/2008  . ADENOCARCINOMA, BREAST, HX OF 02/06/2008  . HELICOBACTER PYLORI GASTRITIS, HX OF 02/06/2008    Past Medical History:  Diagnosis Date  . Barrett's esophagus 2014  . Cataracts, bilateral   . GERD (gastroesophageal reflux disease)    occasional - diet controlled  . Headache    occasional  . Hearing loss    left  . Hyperlipidemia   . lt breast ca dx'd 2003   chemo/xrt comp/ left  . Neuropathy    both feet     Past Surgical History:  Procedure Laterality Date  . BREAST LUMPECTOMY  2003   left - bx  . COLONOSCOPY     last colon 2005  . DILATATION & CURETTAGE/HYSTEROSCOPY WITH MYOSURE N/A 02/14/2017   Procedure: DILATATION & CURETTAGE/HYSTEROSCOPY WITH MYOSURE;  Surgeon: [name], MD;  Location: [facility];  Service: Gynecology;  Laterality: N/A;  follow first case  . Left knee  2014   meniscus  . POLYPECTOMY  2000  . UPPER GI ENDOSCOPY      Current Outpatient Prescriptions  Medication Sig Dispense Refill  . acetaminophen (TYLENOL) 325 MG tablet Take 325 mg by mouth daily as needed for moderate pain or headache.    Marland Kitchen aspirin 81 MG tablet Take 81 mg by mouth daily.     . Biotin 5000 MCG CAPS Take 5,000 mcg by mouth daily.    . Cholecalciferol (VITAMIN D PO) Take 1 capsule by mouth  daily.     Marland Kitchen doxycycline (VIBRAMYCIN) 100 MG capsule Take 1 capsule (100 mg total) by mouth 2 (two) times daily. Take BID for 7 days.  Take with food as can cause GI distress. 14 capsule 0  . Flaxseed, Linseed, (FLAX SEED OIL) 1000 MG CAPS Take 1,000 mg by mouth daily.    . pravastatin (PRAVACHOL) 40 MG tablet Take 40 mg by mouth at bedtime.  0  . Tetrahydrozoline HCl (VISINE OP) Apply 1 drop to eye daily as needed (dry eyes).    Marland Kitchen zolpidem (AMBIEN) 5 MG tablet Take 0.5 tablets by mouth at bedtime.   0   No current facility-administered medications for this visit.      ALLERGIES: Codeine  Family History  Problem Relation Age of Onset  . Cancer Mother     uterine  . Stroke Father 12  . Cancer Sister     pancreatic  . Colon cancer Neg Hx     Social History   Social History  . Marital status: Widowed    Spouse name: N/A  . Number of children: 0  . Years of education: N/A   Occupational History  . Not on file.   Social History Main Topics  .  Smoking status: Former Smoker    Packs/day: 1.00    Years: 30.00    Quit date: 04/08/2002  . Smokeless tobacco: Never Used  . Alcohol use 8.4 oz/week    14 Glasses of wine per week  . Drug use: No  . Sexual activity: No   Other Topics Concern  . Not on file   Social History Narrative   Lives with husband who was a Teacher, music for [employer]    Review of Systems  Constitutional: Negative.   HENT: Negative.   Eyes: Negative.   Respiratory: Negative.   Cardiovascular: Negative.   Gastrointestinal: Negative.   Genitourinary:       Perineal abscess   Musculoskeletal: Negative.   Skin: Negative.   Neurological: Negative.   Endo/Heme/Allergies: Negative.   Psychiatric/Behavioral: Negative.     PHYSICAL EXAMINATION:    BP 114/60 (BP Location: Right Arm, Patient Position: Sitting, Cuff Size: Normal)   Pulse 88   Resp 18   Wt 137 lb (62.1 kg)   LMP 11/08/1985 (Approximate)   BMI 24.27 kg/m     General appearance:  alert, cooperative and appears stated age  Pelvic: External genitalia:  Abscess has markedly improved, cavity gently cleaned with sterile saline on a cotton swab, no active drainage. The induration has improved, marked improvement in the cellulitis, no significant surrounding erythema today   Chaperone was present for exam.  ASSESSMENT Perineal abscess, s/p I&D yesterday, recovering well    PLAN Discussed local care F/U on Monday Call with any concerns Continue the antibiotics   An After Visit Summary was printed and given to the patient.

## 2017-02-26 LAB — WOUND CULTURE
Gram Stain: NONE SEEN
Organism ID, Bacteria: NO GROWTH

## 2017-02-28 ENCOUNTER — Encounter: Payer: Self-pay | Admitting: Obstetrics and Gynecology

## 2017-02-28 ENCOUNTER — Ambulatory Visit (INDEPENDENT_AMBULATORY_CARE_PROVIDER_SITE_OTHER): Payer: Medicare Other | Admitting: Obstetrics and Gynecology

## 2017-02-28 VITALS — BP 130/70 | HR 58 | Resp 14 | Wt 137.0 lb

## 2017-02-28 DIAGNOSIS — L02215 Cutaneous abscess of perineum: Secondary | ICD-10-CM | POA: Diagnosis not present

## 2017-02-28 NOTE — Progress Notes (Signed)
GYNECOLOGY  VISIT   HPI: 82 y.o.   Widowed  Caucasian  female   [place] with Patient's last menstrual period was 11/08/1985 (approximate).   here for follow up perineal abscess. She was treated with I&D and antibiotics last week. The area is feeling better, area is less swollen and less tender.  Patient is 2 weeks s/p D&C with myosure   GYNECOLOGIC HISTORY: Patient's last menstrual period was 11/08/1985 (approximate). Contraception:postmenopause  Menopausal hormone therapy: none         OB History    Gravida Para Term Preterm AB Living   0 0 0 0 0 0   SAB TAB Ectopic Multiple Live Births   0 0 0 0 0         Patient Active Problem List   Diagnosis Date Noted  . Perineal abscess 02/24/2017  . GERD 02/06/2008  . BARRETTS ESOPHAGUS 02/06/2008  . ADENOCARCINOMA, BREAST, HX OF 02/06/2008  . HELICOBACTER PYLORI GASTRITIS, HX OF 02/06/2008    Past Medical History:  Diagnosis Date  . Barrett's esophagus 2014  . Cataracts, bilateral   . GERD (gastroesophageal reflux disease)    occasional - diet controlled  . Headache    occasional  . Hearing loss    left  . Hyperlipidemia   . lt breast ca dx'd 2003   chemo/xrt comp/ left  . Neuropathy    both feet     Past Surgical History:  Procedure Laterality Date  . BREAST LUMPECTOMY  2003   left - bx  . COLONOSCOPY     last colon 2005  . DILATATION & CURETTAGE/HYSTEROSCOPY WITH MYOSURE N/A 02/14/2017   Procedure: DILATATION & CURETTAGE/HYSTEROSCOPY WITH MYOSURE;  Surgeon: [name], MD;  Location: [facility];  Service: Gynecology;  Laterality: N/A;  follow first case  . Left knee  2014   meniscus  . POLYPECTOMY  2000  . UPPER GI ENDOSCOPY      Current Outpatient Prescriptions  Medication Sig Dispense Refill  . acetaminophen (TYLENOL) 325 MG tablet Take 325 mg by mouth daily as needed for moderate pain or headache.    Marland Kitchen aspirin 81 MG tablet Take 81 mg by mouth daily.     . Biotin 5000 MCG CAPS Take 5,000 mcg by mouth  daily.    . Cholecalciferol (VITAMIN D PO) Take 1 capsule by mouth daily.     Marland Kitchen doxycycline (VIBRAMYCIN) 100 MG capsule Take 1 capsule (100 mg total) by mouth 2 (two) times daily. Take BID for 7 days.  Take with food as can cause GI distress. 14 capsule 0  . Flaxseed, Linseed, (FLAX SEED OIL) 1000 MG CAPS Take 1,000 mg by mouth daily.    . pravastatin (PRAVACHOL) 40 MG tablet Take 40 mg by mouth at bedtime.  0  . Tetrahydrozoline HCl (VISINE OP) Apply 1 drop to eye daily as needed (dry eyes).    Marland Kitchen zolpidem (AMBIEN) 5 MG tablet Take 0.5 tablets by mouth at bedtime.   0   No current facility-administered medications for this visit.      ALLERGIES: Codeine  Family History  Problem Relation Age of Onset  . Cancer Mother     uterine  . Stroke Father 33  . Cancer Sister     pancreatic  . Colon cancer Neg Hx     Social History   Social History  . Marital status: Widowed    Spouse name: N/A  . Number of children: 0  . Years of education: N/A  Occupational History  . Not on file.   Social History Main Topics  . Smoking status: Former Smoker    Packs/day: 1.00    Years: 30.00    Quit date: 04/08/2002  . Smokeless tobacco: Never Used  . Alcohol use 8.4 oz/week    14 Glasses of wine per week  . Drug use: No  . Sexual activity: No   Other Topics Concern  . Not on file   Social History Narrative   Lives with husband who was a Teacher, music for [employer]    Review of Systems  Constitutional: Negative.   HENT: Negative.   Eyes: Negative.   Respiratory: Negative.   Cardiovascular: Negative.   Gastrointestinal: Negative.   Genitourinary: Negative.   Musculoskeletal: Negative.   Skin: Negative.   Neurological: Negative.   Endo/Heme/Allergies: Negative.   Psychiatric/Behavioral: Negative.     PHYSICAL EXAMINATION:    BP 130/70 (BP Location: Right Arm, Patient Position: Sitting, Cuff Size: Normal)   Pulse (!) 58   Resp 14   Wt 137 lb (62.1 kg)   LMP 11/08/1985  (Approximate)   BMI 24.27 kg/m     General appearance: alert, cooperative and appears stated age  Pelvic: External genitalia:  no lesions, area of perineal abscess is healing well, no erythema, no induration, wound healing               ASSESSMENT Perineal abscess, healing well, no signs of infection    PLAN Routine f/u   An After Visit Summary was printed and given to the patient.

## 2017-04-14 DIAGNOSIS — R079 Chest pain, unspecified: Secondary | ICD-10-CM | POA: Diagnosis not present

## 2017-10-11 ENCOUNTER — Encounter: Payer: Self-pay | Admitting: Neurology

## 2017-10-12 ENCOUNTER — Other Ambulatory Visit: Payer: Self-pay | Admitting: Internal Medicine

## 2017-10-12 DIAGNOSIS — R413 Other amnesia: Secondary | ICD-10-CM

## 2017-10-25 ENCOUNTER — Ambulatory Visit
Admission: RE | Admit: 2017-10-25 | Discharge: 2017-10-25 | Disposition: A | Discharge status: Home / Self Care | Payer: Medicare Other | Source: Ambulatory Visit | Attending: Internal Medicine | Admitting: Internal Medicine

## 2017-10-25 DIAGNOSIS — R413 Other amnesia: Secondary | ICD-10-CM

## 2018-01-16 ENCOUNTER — Ambulatory Visit: Payer: Medicare Other | Admitting: Neurology

## 2018-01-16 ENCOUNTER — Encounter: Payer: Self-pay | Admitting: Neurology

## 2018-01-16 VITALS — BP 140/70 | HR 67 | Ht 62.5 in | Wt 139.1 lb

## 2018-01-16 DIAGNOSIS — G620 Drug-induced polyneuropathy: Secondary | ICD-10-CM

## 2018-01-16 DIAGNOSIS — G3184 Mild cognitive impairment, so stated: Secondary | ICD-10-CM | POA: Diagnosis not present

## 2018-01-16 DIAGNOSIS — T451X5A Adverse effect of antineoplastic and immunosuppressive drugs, initial encounter: Secondary | ICD-10-CM | POA: Diagnosis not present

## 2018-01-16 NOTE — Progress Notes (Signed)
[facility] Clinic Note - Initial Visit   Date: 01/16/18  Kathleen Neal MRN: [id number] DOB: 21-Oct-1936   Dear Dr. Joylene Draft:  Thank you for your kind referral of Kathleen Neal for consultation of neuropathy. Although her history is well known to you, please allow [place] to reiterate it for the purpose of our medical record. The patient was accompanied to the clinic by self.   History of Present Illness: Kathleen Neal is a 82 y.o. right-handed [place] female with left breast cancer s/p lumpectomy, chemotherapy, and radiation 2002-02-27), TIA 02/27/14), hyperlipidemia, hypertension, and GERD presenting for evaluation of bilateral feet paresthesias.    She was diagnosed with left breast cancer in 27-Feb-2002 and when she started chemotherapy, started noticing numbness over the toes.  Over the years, it has crept into her lower legs.  She has mild tingling of the feet.  She denies any imbalance and continues to feel stable in high heels.  She does not have any weakness.  She does not have history of diabetes, heavy alcohol use, or family history of neuropathy.  Her late husband passed away in 2017/02/27 and she was the primary caregiver to them for the last 10-15 years of his life.  He was always managing their finances and taxes, so the past year has been overwhelming.  She complains of short-term memory loss, especially with names.  She occasionally misplaces things, but now that they have a smaller home, this is less frequent.  She now manages all IADLs (finances, driving, medications, household chores).  She is worried about her future driving if her neuropathy progresses.  She has not had any instances with her foot slipping or difficulty manipulating the pedals. MRI brain from December 2018 shows chronic small vessel disease.  Vitamin B12, TSH, RPR is normal.    Out-side paper records, electronic medical record, and images have been reviewed where available and summarized as:  MRI brain  wo contrast 10/25/2017:   No acute intracranial process. Next item moderate chronic small vessel ischemic disease.   Past Medical History:  Diagnosis Date  . Barrett's esophagus 2013/02/27  . Cataracts, bilateral   . GERD (gastroesophageal reflux disease)    occasional - diet controlled  . Headache    occasional  . Hearing loss    left  . Hyperlipidemia   . lt breast ca dx'd 02/27/2002   chemo/xrt comp/ left  . Neuropathy    both feet     Past Surgical History:  Procedure Laterality Date  . BREAST LUMPECTOMY  27-Feb-2002   left - bx  . COLONOSCOPY     last colon 28-Feb-2004  . DILATATION & CURETTAGE/HYSTEROSCOPY WITH MYOSURE N/A 02/14/2017   Procedure: DILATATION & CURETTAGE/HYSTEROSCOPY WITH MYOSURE;  Surgeon: [name], MD;  Location: [facility];  Service: Gynecology;  Laterality: N/A;  follow first case  . Left knee  02-27-13   meniscus  . POLYPECTOMY  02-28-99  . UPPER GI ENDOSCOPY       Medications:  Outpatient Encounter Medications as of 01/16/2018  Medication Sig  . acetaminophen (TYLENOL) 325 MG tablet Take 325 mg by mouth daily as needed for moderate pain or headache.  Marland Kitchen aspirin 81 MG tablet Take 81 mg by mouth daily.   . Biotin 5000 MCG CAPS Take 5,000 mcg by mouth daily.  . Cholecalciferol (VITAMIN D PO) Take 1 capsule by mouth daily.   Marland Kitchen donepezil (ARICEPT) 5 MG tablet TAKE 1 TABLET BY MOUTH ONCE EVERY EVENING  . Flaxseed, Linseed, (  FLAX SEED OIL) 1000 MG CAPS Take 1,000 mg by mouth daily.  . pravastatin (PRAVACHOL) 40 MG tablet Take 40 mg by mouth at bedtime.  . Tetrahydrozoline HCl (VISINE OP) Apply 1 drop to eye daily as needed (dry eyes).  Marland Kitchen zolpidem (AMBIEN) 5 MG tablet Take 0.5 tablets by mouth at bedtime.   . [DISCONTINUED] doxycycline (VIBRAMYCIN) 100 MG capsule Take 1 capsule (100 mg total) by mouth 2 (two) times daily. Take BID for 7 days.  Take with food as can cause GI distress.   No facility-administered encounter medications on file as of 01/16/2018.      Allergies:    Allergies  Allergen Reactions  . Codeine Nausea And Vomiting    Family History: Family History  Problem Relation Age of Onset  . Cancer Mother        uterine  . Stroke Father 21  . Cancer Sister        pancreatic  . Colon cancer Neg Hx     Social History: Social History   Tobacco Use  . Smoking status: Former Smoker    Packs/day: 1.00    Years: 30.00    Pack years: 30.00    Last attempt to quit: 04/08/2002    Years since quitting: 15.7  . Smokeless tobacco: Never Used  Substance Use Topics  . Alcohol use: Yes    Alcohol/week: 8.4 oz    Types: 14 Glasses of wine per week  . Drug use: No   Social History   Social History Narrative   Lives alone in a one story home.  Has no children.       Review of Systems:  CONSTITUTIONAL: No fevers, chills, night sweats, or weight loss.   EYES: No visual changes or eye pain ENT: No hearing changes.  No history of nose bleeds.   RESPIRATORY: No cough, wheezing and shortness of breath.   CARDIOVASCULAR: Negative for chest pain, and palpitations.   GI: Negative for abdominal discomfort, blood in stools or black stools.  No recent change in bowel habits.   GU:  No history of incontinence.   MUSCLOSKELETAL: No history of joint pain or swelling.  No myalgias.   SKIN: Negative for lesions, rash, and itching.   HEMATOLOGY/ONCOLOGY: Negative for prolonged bleeding, bruising easily, and swollen nodes.  +history of cancer.   ENDOCRINE: Negative for cold or heat intolerance, polydipsia or goiter.   PSYCH:  No depression or anxiety symptoms.   NEURO: As Above.   Vital Signs:  BP 140/70   Pulse 67   Ht 5' 2.5" (1.588 m)   Wt 139 lb 2 oz (63.1 kg)   LMP 11/08/1985 (Approximate)   SpO2 97%   BMI 25.04 kg/m    General Medical Exam:   General:  Well appearing, comfortable.   Eyes/ENT: see cranial nerve examination.   Neck: No masses appreciated.  Full range of motion without tenderness.  No carotid bruits. Respiratory:  Clear to  auscultation, good air entry bilaterally.   Cardiac:  Regular rate and rhythm, no murmur.   Extremities:  Left 4th digit deformity due to animal bite at the age of 4.  Skin:  No rashes or lesions.  Neurological Exam: MENTAL STATUS including orientation to time, place, person, recent and remote memory, attention span and concentration, language, and fund of knowledge is fairly intact.  Speech is not dysarthric. Montreal Cognitive Assessment  01/16/2018  Visuospatial/ Executive (0/5) 4  Naming (0/3) 3  Attention: Read list of digits (0/2)  2  Attention: Read list of letters (0/1) 1  Attention: Serial 7 subtraction starting at 100 (0/3) 2  Language: Repeat phrase (0/2) 2  Language : Fluency (0/1) 0  Abstraction (0/2) 2  Delayed Recall (0/5) 0  Orientation (0/6) 5  Total 21  Adjusted Score (based on education) 21   CRANIAL NERVES: II:  No visual field defects.  Unremarkable fundi.   III-IV-VI: Pupils equal round and reactive to light.  Normal conjugate, extra-ocular eye movements in all directions of gaze.  No nystagmus.  No ptosis.   V:  Normal facial sensation.     VII:  Normal facial symmetry and movements.  VIII:  Normal hearing and vestibular function.   IX-X:  Normal palatal movement.   XI:  Normal shoulder shrug and head rotation.   XII:  Normal tongue strength and range of motion, no deviation or fasciculation.  MOTOR:  No atrophy, fasciculations or abnormal movements.  No pronator drift.  Tone is normal.    Right Upper Extremity:    Left Upper Extremity:    Deltoid  5/5   Deltoid  5/5   Biceps  5/5   Biceps  5/5   Triceps  5/5   Triceps  5/5   Wrist extensors  5/5   Wrist extensors  5/5   Wrist flexors  5/5   Wrist flexors  5/5   Finger extensors  5/5   Finger extensors  5/5   Finger flexors  5/5   Finger flexors  5/5   Dorsal interossei  5/5   Dorsal interossei  5/5   Abductor pollicis  5/5   Abductor pollicis  5/5   Tone (Ashworth scale)  0  Tone (Ashworth scale)  0    Right Lower Extremity:    Left Lower Extremity:    Hip flexors  5/5   Hip flexors  5/5   Hip extensors  5/5   Hip extensors  5/5   Knee flexors  5/5   Knee flexors  5/5   Knee extensors  5/5   Knee extensors  5/5   Dorsiflexors  5/5   Dorsiflexors  5/5   Plantarflexors  5/5   Plantarflexors  5/5   Toe extensors  5/5   Toe extensors  5/5   Toe flexors  5/5   Toe flexors  5/5   Tone (Ashworth scale)  0  Tone (Ashworth scale)  0   MSRs:  Right                                                                 Left brachioradialis 2+  brachioradialis 2+  biceps 2+  biceps 2+  triceps 2+  triceps 2+  patellar 2+  patellar 2+  ankle jerk 0  ankle jerk 0  Hoffman no  Hoffman no  plantar response down  plantar response down   SENSORY:  Vibration is absent at the great toe, reduced at the ankles bilaterally.  Temperature and pin prick confirms to a gradient loss, which is worse distal to ankles.  There is mild sway with Rhomberg testing.  COORDINATION/GAIT: Normal finger-to- nose-finger.  Intact rapid alternating movements bilaterally.  Gait narrow based and stable. Tandem and stressed gait intact.    IMPRESSION: 1.  Chemotherapy-induced neuropathy  manifesting with distal numbness > tingling.  There is mild sensory ataxia and distal sensory loss on exam, but her gait is impressive as even her tandem gait appears very stable.  There is no distal weakness.  Patient was informed that there is no new treatment for neuropathy and if her balance is becoming a problem, balance training exercises should be started.  She would like to start this through her retirement community.  As far as driving, she denies any safety concerns and will self-restrict, as needed.  I have asked her to limit driving to local distances, day-time, and avoid heavy traffic times.    2.  Mild cognitive impairment.  She remains highly independent and I do not believe that her cognitive screening was a true representation of her  cognitive ability due to her language barrier.  She will consider formal neurocognitive testing, if symptoms get worse.  In the meantime, I encouraged her to stay physically and mentally active.    Return to clinic as needed   Greater than 50% of this 45 minute visit was spent in counseling, explanation of diagnosis, planning of further management, and coordination of care.   Thank you for allowing me to participate in patient's care.  If I can answer any additional questions, I would be pleased to do so.    Sincerely,    Tariya Morrissette K. Posey Pronto, DO

## 2018-01-16 NOTE — Patient Instructions (Addendum)
Start an exercise program to work on your balance  Return to clinic as needed

## 2018-01-19 ENCOUNTER — Ambulatory Visit: Payer: Medicare Other | Admitting: Podiatry

## 2018-01-19 ENCOUNTER — Encounter: Payer: Self-pay | Admitting: Podiatry

## 2018-01-19 DIAGNOSIS — L600 Ingrowing nail: Secondary | ICD-10-CM | POA: Diagnosis not present

## 2018-01-19 DIAGNOSIS — B351 Tinea unguium: Secondary | ICD-10-CM

## 2018-01-19 NOTE — Progress Notes (Signed)
Subjective:   Patient ID: Kathleen Neal, female   DOB: 82 y.o.   MRN: [id number]   HPI Patient presents stating she has had long-term damage to her left big toenail and she knows she probably needs to get it removed long-term but she wants my opinion.  Patient does not smoke and likes to be active   Review of Systems  All other systems reviewed and are negative.       Objective:  Physical Exam  Constitutional: She appears well-developed and well-nourished.  Cardiovascular: Intact distal pulses.  Pulmonary/Chest: Effort normal.  Musculoskeletal: Normal range of motion.  Neurological: She is alert.  Skin: Skin is warm.  Nursing note and vitals reviewed.   Neurovascular status found to be intact muscle strength was adequate range of motion within normal limits with patient found to have a severely damaged thickened hallux nail left that the distal two thirds of the nail that has been lost secondary to trauma.  Has good digital perfusion well oriented x3     Assessment:  Damaged left hallux nail with history of mycotic infection also present F2 H&P condition reviewed at great length.  Discussed different treatment options and at this point we just can keep it smooth down and then ultimately permanent procedure will probably be necessary.  Patient will be seen back to recheck in the winter and this procedure can be performed     Plan:  Above plan discussed with patient

## 2018-01-19 NOTE — Patient Instructions (Signed)

## 2018-01-19 NOTE — Progress Notes (Signed)
   Subjective:    Patient ID: Kathleen Neal, female    DOB: 1936/04/10, 82 y.o.   MRN: [id number]  HPI    Review of Systems  All other systems reviewed and are negative.      Objective:   Physical Exam        Assessment & Plan:

## 2018-03-09 ENCOUNTER — Encounter: Payer: Self-pay | Admitting: Gastroenterology

## 2018-03-13 ENCOUNTER — Telehealth: Payer: Self-pay | Admitting: Obstetrics and Gynecology

## 2018-03-13 NOTE — Telephone Encounter (Signed)
Spoke with patient and she is requesting appointment for possible vaginal or urinary infection. Patient states  She was recently SA and partner had urine test which found Trich and she needs testing now. Patient is very embarrassed and did not want to discuss this with me. She is not having any symptoms but wanted to be evaluated. Appointment made with Dr.Jertson for 03/14/18 2:15pm.

## 2018-03-13 NOTE — Telephone Encounter (Signed)
Patient thinks she may have some type of infection.

## 2018-03-14 ENCOUNTER — Other Ambulatory Visit: Payer: Self-pay

## 2018-03-14 ENCOUNTER — Encounter: Payer: Self-pay | Admitting: Obstetrics and Gynecology

## 2018-03-14 ENCOUNTER — Ambulatory Visit: Payer: Medicare Other | Admitting: Obstetrics and Gynecology

## 2018-03-14 VITALS — BP 136/80 | HR 72 | Resp 14 | Wt 139.0 lb

## 2018-03-14 DIAGNOSIS — Z202 Contact with and (suspected) exposure to infections with a predominantly sexual mode of transmission: Secondary | ICD-10-CM | POA: Diagnosis not present

## 2018-03-14 DIAGNOSIS — B9689 Other specified bacterial agents as the cause of diseases classified elsewhere: Secondary | ICD-10-CM

## 2018-03-14 DIAGNOSIS — Z113 Encounter for screening for infections with a predominantly sexual mode of transmission: Secondary | ICD-10-CM | POA: Diagnosis not present

## 2018-03-14 DIAGNOSIS — N760 Acute vaginitis: Secondary | ICD-10-CM | POA: Diagnosis not present

## 2018-03-14 MED ORDER — METRONIDAZOLE 500 MG PO TABS: 500.0000 mg | ORAL_TABLET | Freq: Two times a day (BID) | ORAL | 0 refills | Status: DC

## 2018-03-14 NOTE — Patient Instructions (Signed)
Bacterial Vaginosis Bacterial vaginosis is a vaginal infection that occurs when the normal balance of bacteria in the vagina is disrupted. It results from an overgrowth of certain bacteria. This is the most common vaginal infection among women ages 15-44. Because bacterial vaginosis increases your risk for STIs (sexually transmitted infections), getting treated can help reduce your risk for chlamydia, gonorrhea, herpes, and HIV (human immunodeficiency virus). Treatment is also important for preventing complications in pregnant women, because this condition can cause an early (premature) delivery. What are the causes? This condition is caused by an increase in harmful bacteria that are normally present in small amounts in the vagina. However, the reason that the condition develops is not fully understood. What increases the risk? The following factors may make you more likely to develop this condition:  Having a new sexual partner or multiple sexual partners.  Having unprotected sex.  Douching.  Having an intrauterine device (IUD).  Smoking.  Drug and alcohol abuse.  Taking certain antibiotic medicines.  Being pregnant.  You cannot get bacterial vaginosis from toilet seats, bedding, swimming pools, or contact with objects around you. What are the signs or symptoms? Symptoms of this condition include:  Grey or white vaginal discharge. The discharge can also be watery or foamy.  A fish-like odor with discharge, especially after sexual intercourse or during menstruation.  Itching in and around the vagina.  Burning or pain with urination.  Some women with bacterial vaginosis have no signs or symptoms. How is this diagnosed? This condition is diagnosed based on:  Your medical history.  A physical exam of the vagina.  Testing a sample of vaginal fluid under a microscope to look for a large amount of bad bacteria or abnormal cells. Your health care provider may use a cotton swab  or a small wooden spatula to collect the sample.  How is this treated? This condition is treated with antibiotics. These may be given as a pill, a vaginal cream, or a medicine that is put into the vagina (suppository). If the condition comes back after treatment, a second round of antibiotics may be needed. Follow these instructions at home: Medicines  Take over-the-counter and prescription medicines only as told by your health care provider.  Take or use your antibiotic as told by your health care provider. Do not stop taking or using the antibiotic even if you start to feel better. General instructions  If you have a female sexual partner, tell her that you have a vaginal infection. She should see her health care provider and be treated if she has symptoms. If you have a female sexual partner, he does not need treatment.  During treatment: ? Avoid sexual activity until you finish treatment. ? Do not douche. ? Avoid alcohol as directed by your health care provider. ? Avoid breastfeeding as directed by your health care provider.  Drink enough water and fluids to keep your urine clear or pale yellow.  Keep the area around your vagina and rectum clean. ? Wash the area daily with warm water. ? Wipe yourself from front to back after using the toilet.  Keep all follow-up visits as told by your health care provider. This is important. How is this prevented?  Do not douche.  Wash the outside of your vagina with warm water only.  Use protection when having sex. This includes latex condoms and dental dams.  Limit how many sexual partners you have. To help prevent bacterial vaginosis, it is best to have sex with just   one partner (monogamous).  Make sure you and your sexual partner are tested for STIs.  Wear cotton or cotton-lined underwear.  Avoid wearing tight pants and pantyhose, especially during summer.  Limit the amount of alcohol that you drink.  Do not use any products that  contain nicotine or tobacco, such as cigarettes and e-cigarettes. If you need help quitting, ask your health care provider.  Do not use illegal drugs. Where to find more information:  Centers for Disease Control and Prevention: AppraiserFraud.fi  American Sexual Health Association (ASHA): www.ashastd.org  U.S. Department of Health and Financial controller, Office on Women's Health: DustingSprays.pl or SecuritiesCard.it Contact a health care provider if:  Your symptoms do not improve, even after treatment.  You have more discharge or pain when urinating.  You have a fever.  You have pain in your abdomen.  You have pain during sex.  You have vaginal bleeding between periods. Summary  Bacterial vaginosis is a vaginal infection that occurs when the normal balance of bacteria in the vagina is disrupted.  Because bacterial vaginosis increases your risk for STIs (sexually transmitted infections), getting treated can help reduce your risk for chlamydia, gonorrhea, herpes, and HIV (human immunodeficiency virus). Treatment is also important for preventing complications in pregnant women, because the condition can cause an early (premature) delivery.  This condition is treated with antibiotic medicines. These may be given as a pill, a vaginal cream, or a medicine that is put into the vagina (suppository). This information is not intended to replace advice given to you by your health care provider. Make sure you discuss any questions you have with your health care provider. Document Released: 10/25/2005 Document Revised: 02/28/2017 Document Reviewed: 07/10/2016 Elsevier Interactive Patient Education  2018 [employer]. Trichomoniasis Trichomoniasis is an STI (sexually transmitted infection) that can affect both women and men. In women, the outer area of the female genitalia (vulva) and the vagina are affected. In men, the penis is mainly affected, but the  prostate and other reproductive organs can also be involved. This condition can be treated with medicine. It often has no symptoms (is asymptomatic), especially in men. What are the causes? This condition is caused by an organism called Trichomonas vaginalis. Trichomoniasis most often spreads from person to person (is contagious) through sexual contact. What increases the risk? The following factors may make you more likely to develop this condition:  Having unprotected sexual intercourse.  Having sexual intercourse with a partner who has trichomoniasis.  Having multiple sexual partners.  Having had previous trichomoniasis infections or other STIs.  What are the signs or symptoms? In women, symptoms of trichomoniasis include:  Abnormal vaginal discharge that is clear, white, gray, or yellow-green and foamy and has an unusual "fishy" odor.  Itching and irritation of the vagina and vulva.  Burning or pain during urination or sexual intercourse.  Genital redness and swelling.  In men, symptoms of trichomoniasis include:  Penile discharge that may be foamy or contain pus.  Pain in the penis. This may happen only when urinating.  Itching or irritation inside the penis.  Burning after urination or ejaculation.  How is this diagnosed? In women, this condition may be found during a routine Pap test or physical exam. It may be found in men during a routine physical exam. Your health care provider may perform tests to help diagnose this infection, such as:  Urine tests (men and women).  The following in women: ? Testing the pH of the vagina. ? A vaginal swab  test that checks for the Trichomonas vaginalis organism. ? Testing vaginal secretions.  Your health care provider may test you for other STIs, including HIV (human immunodeficiency virus). How is this treated? This condition is treated with medicine taken by mouth (orally), such as metronidazole or tinidazole to fight the  infection. Your sexual partner(s) may also need to be tested and treated.  If you are a woman and you plan to become pregnant or think you may be pregnant, tell your health care provider right away. Some medicines that are used to treat the infection should not be taken during pregnancy.  Your health care provider may recommend over-the-counter medicines or creams to help relieve itching or irritation. You may be tested for infection again 3 months after treatment. Follow these instructions at home:  Take and use over-the-counter and prescription medicines, including creams, only as told by your health care provider.  Do not have sexual intercourse until one week after you finish your medicine, or until your health care provider approves. Ask your health care provider when you may resume sexual intercourse.  (Women) Do not douche or wear tampons while you have the infection.  Discuss your infection with your sexual partner(s). Make sure that your partner gets tested and treated, if necessary.  Keep all follow-up visits as told by your health care provider. This is important. How is this prevented?  Use condoms every time you have sex. Using condoms correctly and consistently can help protect against STIs.  Avoid having multiple sexual partners.  Talk with your sexual partner about any symptoms that either of you may have, as well as any history of STIs.  Get tested for STIs and STDs (sexually transmitted diseases) before you have sex. Ask your partner to do the same.  Do not have sexual contact if you have symptoms of trichomoniasis or another STI. Contact a health care provider if:  You still have symptoms after you finish your medicine.  You develop pain in your abdomen.  You have pain when you urinate.  You have bleeding after sexual intercourse.  You develop a rash.  You feel nauseous or you vomit.  You plan to become pregnant or think you may be  pregnant. Summary  Trichomoniasis is an STI (sexually transmitted infection) that can affect both women and men.  This condition often has no symptoms (is asymptomatic), especially in men.  You should not have sexual intercourse until one week after you finish your medicine, or until your health care provider approves. Ask your health care provider when you may resume sexual intercourse.  Discuss your infection with your sexual partner. Make sure that your partner gets tested and treated, if necessary. This information is not intended to replace advice given to you by your health care provider. Make sure you discuss any questions you have with your health care provider. Document Released: 04/20/2001 Document Revised: 09/17/2016 Document Reviewed: 09/17/2016 Elsevier Interactive Patient Education  2017 [employer].

## 2018-03-14 NOTE — Progress Notes (Signed)
GYNECOLOGY  VISIT   HPI: 82 y.o.   Widowed  Caucasian  female   [place] with Patient's last menstrual period was 11/08/1985 (approximate).   here c/o exposure to trich. She hasn't had sex in a long time, had a sexual encounter about 6 weeks ago with a friend. The gentleman later informed her that he had trich. She denies any symptoms.   GYNECOLOGIC HISTORY: Patient's last menstrual period was 11/08/1985 (approximate). Contraception:postmenopause  Menopausal hormone therapy: none         OB History    Gravida  0   Para  0   Term  0   Preterm  0   AB  0   Living  0     SAB  0   TAB  0   Ectopic  0   Multiple  0   Live Births  0              Patient Active Problem List   Diagnosis Date Noted  . Perineal abscess 02/24/2017  . GERD 02/06/2008  . BARRETTS ESOPHAGUS 02/06/2008  . ADENOCARCINOMA, BREAST, HX OF 02/06/2008  . HELICOBACTER PYLORI GASTRITIS, HX OF 02/06/2008    Past Medical History:  Diagnosis Date  . Barrett's esophagus 2014  . Cataracts, bilateral   . GERD (gastroesophageal reflux disease)    occasional - diet controlled  . Headache    occasional  . Hearing loss    left  . Hyperlipidemia   . lt breast ca dx'd 2003   chemo/xrt comp/ left  . Neuropathy    both feet     Past Surgical History:  Procedure Laterality Date  . BREAST LUMPECTOMY  2003   left - bx  . COLONOSCOPY     last colon 2005  . DILATATION & CURETTAGE/HYSTEROSCOPY WITH MYOSURE N/A 02/14/2017   Procedure: DILATATION & CURETTAGE/HYSTEROSCOPY WITH MYOSURE;  Surgeon: [name], MD;  Location: [facility];  Service: Gynecology;  Laterality: N/A;  follow first case  . Left knee  2014   meniscus  . POLYPECTOMY  2000  . UPPER GI ENDOSCOPY      Current Outpatient Medications  Medication Sig Dispense Refill  . acetaminophen (TYLENOL) 325 MG tablet Take 325 mg by mouth daily as needed for moderate pain or headache.    Marland Kitchen aspirin 81 MG tablet Take 81 mg by mouth daily.      . Biotin 5000 MCG CAPS Take 5,000 mcg by mouth daily.    . Cholecalciferol (VITAMIN D PO) Take 1 capsule by mouth daily.     Marland Kitchen donepezil (ARICEPT) 5 MG tablet TAKE 1 TABLET BY MOUTH ONCE EVERY EVENING  11  . Flaxseed, Linseed, (FLAX SEED OIL) 1000 MG CAPS Take 1,000 mg by mouth daily.    . pravastatin (PRAVACHOL) 40 MG tablet Take 40 mg by mouth at bedtime.  0  . Tetrahydrozoline HCl (VISINE OP) Apply 1 drop to eye daily as needed (dry eyes).    Marland Kitchen zolpidem (AMBIEN) 5 MG tablet Take 0.5 tablets by mouth at bedtime.   0  . metroNIDAZOLE (FLAGYL) 500 MG tablet Take 1 tablet (500 mg total) by mouth 2 (two) times daily. 14 tablet 0   No current facility-administered medications for this visit.      ALLERGIES: Codeine  Family History  Problem Relation Age of Onset  . Cancer Mother        uterine  . Stroke Father 92  . Cancer Sister  pancreatic  . Colon cancer Neg Hx     Social History   Socioeconomic History  . Marital status: Widowed    Spouse name: Not on file  . Number of children: 0  . Years of education: 85  . Highest education level: Not on file  Occupational History  . Not on file  Social Needs  . Financial resource strain: Not on file  . Food insecurity:    Worry: Not on file    Inability: Not on file  . Transportation needs:    Medical: Not on file    Non-medical: Not on file  Tobacco Use  . Smoking status: Former Smoker    Packs/day: 1.00    Years: 30.00    Pack years: 30.00    Last attempt to quit: 04/08/2002    Years since quitting: 15.9  . Smokeless tobacco: Never Used  Substance and Sexual Activity  . Alcohol use: Yes    Alcohol/week: 8.4 oz    Types: 14 Glasses of wine per week  . Drug use: No  . Sexual activity: Never    Partners: Male    Birth control/protection: Post-menopausal  Lifestyle  . Physical activity:    Days per week: Not on file    Minutes per session: Not on file  . Stress: Not on file  Relationships  . Social connections:     Talks on phone: Not on file    Gets together: Not on file    Attends religious service: Not on file    Active member of club or organization: Not on file    Attends meetings of clubs or organizations: Not on file    Relationship status: Not on file  . Intimate partner violence:    Fear of current or ex partner: Not on file    Emotionally abused: Not on file    Physically abused: Not on file    Forced sexual activity: Not on file  Other Topics Concern  . Not on file  Social History Narrative   Lives alone in a one story home.  Has no children.       Review of Systems  Constitutional: Negative.   HENT: Negative.   Eyes: Negative.   Respiratory: Negative.   Cardiovascular: Negative.   Gastrointestinal: Negative.   Genitourinary: Negative.   Musculoskeletal: Negative.   Skin: Negative.   Neurological: Negative.   Endo/Heme/Allergies: Negative.   Psychiatric/Behavioral: Negative.     PHYSICAL EXAMINATION:    BP 136/80 (BP Location: Right Arm, Patient Position: Sitting, Cuff Size: Normal)   Pulse 72   Resp 14   Wt 139 lb (63 kg)   LMP 11/08/1985 (Approximate)   BMI 25.02 kg/m     General appearance: alert, cooperative and appears stated age  Pelvic: External genitalia:  no lesions              Urethra:  normal appearing urethra with no masses, tenderness or lesions              Bartholins and Skenes: normal                 Vagina: normal appearing vagina with normal color and discharge, no lesions              Cervix: no cervical motion tenderness and no lesions              Bimanual Exam:  Uterus:  normal size, contour, position, consistency, mobility, non-tender  Adnexa: no mass, fullness, tenderness               Chaperone was present for exam.  Wet prep: + clue, ? trich, few wbc KOH: no yeast PH: 5.5   ASSESSMENT Exposure to trich BV on slides    PLAN Treat with flagyl 500 mg BID x 7 days Affirm sent Genprobe sent HIV, RPR She doesn't  plan on being sexually active, recommend condoms if she is   An After Visit Summary was printed and given to the patient.

## 2018-03-15 LAB — RPR: RPR Ser Ql: NONREACTIVE

## 2018-03-15 LAB — VAGINITIS/VAGINOSIS, DNA PROBE
Candida Species: NEGATIVE
GARDNERELLA VAGINALIS: NEGATIVE
TRICHOMONAS VAG: NEGATIVE

## 2018-03-15 LAB — GC/CHLAMYDIA PROBE AMP
Chlamydia trachomatis, NAA: NEGATIVE
NEISSERIA GONORRHOEAE BY PCR: NEGATIVE

## 2018-03-15 LAB — HIV ANTIBODY (ROUTINE TESTING W REFLEX): HIV SCREEN 4TH GENERATION: NONREACTIVE

## 2018-09-13 ENCOUNTER — Other Ambulatory Visit: Payer: Self-pay | Admitting: Internal Medicine

## 2018-09-13 DIAGNOSIS — Z1231 Encounter for screening mammogram for malignant neoplasm of breast: Secondary | ICD-10-CM

## 2019-02-15 ENCOUNTER — Ambulatory Visit: Payer: Medicare Other

## 2020-01-03 ENCOUNTER — Ambulatory Visit (HOSPITAL_COMMUNITY)
Admission: RE | Admit: 2020-01-03 | Discharge: 2020-01-03 | Disposition: A | Discharge status: Home / Self Care | Payer: Medicare Other | Source: Ambulatory Visit | Attending: Vascular Surgery | Admitting: Vascular Surgery

## 2020-01-03 ENCOUNTER — Other Ambulatory Visit (HOSPITAL_COMMUNITY): Payer: Self-pay | Admitting: Internal Medicine

## 2020-01-03 ENCOUNTER — Other Ambulatory Visit: Payer: Self-pay

## 2020-01-03 DIAGNOSIS — R600 Localized edema: Secondary | ICD-10-CM

## 2020-01-03 DIAGNOSIS — M79604 Pain in right leg: Secondary | ICD-10-CM | POA: Diagnosis present

## 2021-03-27 ENCOUNTER — Other Ambulatory Visit: Payer: Self-pay | Admitting: Adult Health

## 2021-03-27 DIAGNOSIS — R1030 Lower abdominal pain, unspecified: Secondary | ICD-10-CM

## 2021-03-27 DIAGNOSIS — R194 Change in bowel habit: Secondary | ICD-10-CM

## 2021-04-15 ENCOUNTER — Other Ambulatory Visit: Payer: Self-pay

## 2021-04-15 ENCOUNTER — Ambulatory Visit
Admission: RE | Admit: 2021-04-15 | Discharge: 2021-04-15 | Disposition: A | Discharge status: Home / Self Care | Payer: Medicare Other | Source: Ambulatory Visit | Attending: Adult Health | Admitting: Adult Health

## 2021-04-15 DIAGNOSIS — R1030 Lower abdominal pain, unspecified: Secondary | ICD-10-CM

## 2021-04-15 DIAGNOSIS — R194 Change in bowel habit: Secondary | ICD-10-CM

## 2021-04-15 MED ORDER — IOPAMIDOL (ISOVUE-300) INJECTION 61%
100.0000 mL | Freq: Once | INTRAVENOUS | Status: AC | PRN
  Administered 2021-04-15: 12:00:00 100 mL via INTRAVENOUS

## 2021-05-20 ENCOUNTER — Other Ambulatory Visit (HOSPITAL_BASED_OUTPATIENT_CLINIC_OR_DEPARTMENT_OTHER): Payer: Self-pay

## 2021-05-20 ENCOUNTER — Ambulatory Visit: Payer: Medicare Other | Attending: Internal Medicine

## 2021-05-20 DIAGNOSIS — Z23 Encounter for immunization: Secondary | ICD-10-CM

## 2021-05-20 MED ORDER — COVID-19 MRNA VACC (MODERNA) 100 MCG/0.5ML IM SUSP
INTRAMUSCULAR | 0 refills | Status: AC
  Filled 2021-05-20: qty 0.25, 1d supply, fill #0

## 2021-05-20 NOTE — Progress Notes (Signed)
   Covid-19 Vaccination Clinic  Name:  Renarda Mullinix    MRN: [id number] DOB: 1936-10-20  05/20/2021  Ms. Aracena was observed post Covid-19 immunization for 15 minutes without incident. She was provided with Vaccine Information Sheet and instruction to access the V-Safe system.   Ms. Diamond was instructed to call 911 with any severe reactions post vaccine: Difficulty breathing  Swelling of face and throat  A fast heartbeat  A bad rash all over body  Dizziness and weakness   Immunizations Administered     Name Date Dose VIS Date Route   Moderna Covid-19 Booster Vaccine 05/20/2021  9:09 AM 0.25 mL 08/27/2020 Intramuscular   Manufacturer: Moderna   Lot: [id number]   [place]: 25525-894-83

## 2021-10-05 ENCOUNTER — Other Ambulatory Visit (HOSPITAL_BASED_OUTPATIENT_CLINIC_OR_DEPARTMENT_OTHER): Payer: Self-pay

## 2021-10-05 ENCOUNTER — Other Ambulatory Visit: Payer: Self-pay

## 2021-10-05 ENCOUNTER — Ambulatory Visit: Payer: Medicare Other | Attending: Internal Medicine

## 2021-10-05 DIAGNOSIS — Z23 Encounter for immunization: Secondary | ICD-10-CM

## 2021-10-05 MED ORDER — MODERNA COVID-19 BIVAL BOOSTER 50 MCG/0.5ML IM SUSP
INTRAMUSCULAR | 0 refills | Status: AC
  Filled 2021-10-05: qty 0.5, 1d supply, fill #0

## 2021-10-05 NOTE — Progress Notes (Signed)
   Covid-19 Vaccination Clinic  Name:  Kathleen Neal    MRN: [id number] DOB: 08-Nov-1936  10/05/2021  [name] was observed post Covid-19 immunization for 15 minutes without incident. She was provided with Vaccine Information Sheet and instruction to access the V-Safe system.   [name] was instructed to call 911 with any severe reactions post vaccine: Difficulty breathing  Swelling of face and throat  A fast heartbeat  A bad rash all over body  Dizziness and weakness   Immunizations Administered     Name Date Dose VIS Date Route   Moderna Covid-19 vaccine Bivalent Booster 10/05/2021  3:30 PM 0.5 mL 06/20/2021 Intramuscular   Manufacturer: Moderna   Lot: [id number]   [place]: 18299-371-69

## 2022-08-11 ENCOUNTER — Other Ambulatory Visit (HOSPITAL_BASED_OUTPATIENT_CLINIC_OR_DEPARTMENT_OTHER): Payer: Self-pay

## 2022-08-11 MED ORDER — INFLUENZA VAC A&B SA ADJ QUAD 0.5 ML IM PRSY
PREFILLED_SYRINGE | INTRAMUSCULAR | 0 refills | Status: AC
  Filled 2022-08-11: qty 0.5, 1d supply, fill #0

## 2023-07-26 ENCOUNTER — Ambulatory Visit: Payer: Medicare Other | Admitting: Internal Medicine

## 2023-08-09 ENCOUNTER — Non-Acute Institutional Stay: Payer: Medicare Other | Admitting: Internal Medicine

## 2023-08-10 LAB — LAB REPORT - SCANNED: EGFR: 42

## 2023-08-10 NOTE — Progress Notes (Signed)
A user error has taken place.

## 2023-08-16 ENCOUNTER — Encounter: Payer: Medicare Other | Admitting: Internal Medicine

## 2023-08-22 ENCOUNTER — Non-Acute Institutional Stay: Payer: Medicare Other | Admitting: Adult Health

## 2023-08-22 ENCOUNTER — Encounter: Payer: Self-pay | Admitting: Adult Health

## 2023-08-22 VITALS — BP 126/72 | HR 75 | Temp 97.8°F | Resp 17 | Ht 62.45 in | Wt 130.0 lb

## 2023-08-22 DIAGNOSIS — I10 Essential (primary) hypertension: Secondary | ICD-10-CM

## 2023-08-22 DIAGNOSIS — E538 Deficiency of other specified B group vitamins: Secondary | ICD-10-CM | POA: Insufficient documentation

## 2023-08-22 DIAGNOSIS — Z853 Personal history of malignant neoplasm of breast: Secondary | ICD-10-CM

## 2023-08-22 DIAGNOSIS — G3184 Mild cognitive impairment, so stated: Secondary | ICD-10-CM

## 2023-08-22 DIAGNOSIS — R4701 Aphasia: Secondary | ICD-10-CM | POA: Insufficient documentation

## 2023-08-22 DIAGNOSIS — E782 Mixed hyperlipidemia: Secondary | ICD-10-CM | POA: Diagnosis not present

## 2023-08-22 DIAGNOSIS — I679 Cerebrovascular disease, unspecified: Secondary | ICD-10-CM | POA: Insufficient documentation

## 2023-08-22 HISTORY — DX: Aphasia: R47.01

## 2023-08-22 HISTORY — DX: Mild cognitive impairment of uncertain or unknown etiology: G31.84

## 2023-08-22 HISTORY — DX: Essential (primary) hypertension: I10

## 2023-08-22 HISTORY — DX: Cerebrovascular disease, unspecified: I67.9

## 2023-08-22 NOTE — Progress Notes (Addendum)
Location:  [facility]  POS: Clinic  Provider: Fletcher Anon, ANP  Goals of Care:     08/22/2023    2:45 PM  Advanced Directives  Does Patient Have a Medical Advance Directive? Yes  Type of Estate agent of [place];Living will;Out of facility DNR (pink MOST or yellow form)  Does patient want to make changes to medical advance directive? No - Patient declined  Copy of Healthcare Power of Attorney in Chart? No - copy requested  Would patient like information on creating a medical advance directive? No - Patient declined     Chief Complaint  Patient presents with   New Patient (Initial Visit)    Patient is here to establish care. Patient states she is disappointed that she can barely talk    HPI: Patient is a 87 y.o. female seen today to establish care.   Her husband passed 4-5 years ago. He was a physician. No children. She lives by herself.  Originally from [place]. First language is [place] Reports some issues with word finding issues. Feels like its easier to speak in [place].   Reports some memory loss.  Able to read well and handles her own finances.  She no longer drives.  No specific exercise program Previously was a patient of Dr Perini's.   Denies any trouble chewing or swallowing food  Does not want to get covid vaccine Declined mammogram does have hx of left breast ca with lumpectomy  No falls  Easily angered about issues with her word finding.  Denies depression. Denies anxiety.   Emeline General, takes a long time to go to sleep  Denies taking aricept but in review of the record this was prescribed by neurology for MCI in 2019.  Alzhemier's dementia dx is noted on her chart in Dr Perini's records.  Prior hx of Barretts esophagus and GERD. No current complaints.   Hx of left breast cancer had lumpectomy, chemo Neuropathy in her feet in the past but she states this no longer bothers her.   Hx of TIA and carotid artery occlusion per  records.   MRI of the brain 10/25/17 IMPRESSION: No acute intracranial process. Next item moderate chronic small vessel ischemic disease.     Past Medical History:  Diagnosis Date   Barrett's esophagus 2014   Cataracts, bilateral    GERD (gastroesophageal reflux disease)    occasional - diet controlled   Headache    occasional   Hearing loss    left   Hyperlipidemia    lt breast ca dx'd 2003   chemo/xrt comp/ left   Neuropathy    both feet     Past Surgical History:  Procedure Laterality Date   BREAST LUMPECTOMY  2003   left - bx   COLONOSCOPY     last colon 2005   DILATATION & CURETTAGE/HYSTEROSCOPY WITH MYOSURE N/A 02/14/2017   Procedure: DILATATION & CURETTAGE/HYSTEROSCOPY WITH MYOSURE;  Surgeon: Romualdo Bolk, MD;  Location: [facility];  Service: Gynecology;  Laterality: N/A;  follow first case   Left knee  2014   meniscus   POLYPECTOMY  2000   UPPER GI ENDOSCOPY      Allergies  Allergen Reactions   Codeine Nausea And Vomiting    Outpatient Encounter Medications as of 08/22/2023  Medication Sig   acetaminophen (TYLENOL) 325 MG tablet Take 325 mg by mouth daily as needed for moderate pain or headache.   aspirin 81 MG tablet Take 81 mg by mouth daily.  Biotin 5000 MCG CAPS Take 5,000 mcg by mouth daily.   Cholecalciferol (VITAMIN D PO) Take 1 capsule by mouth daily.    COVID-19 mRNA bivalent vaccine, Moderna, (MODERNA COVID-19 BIVAL BOOSTER) 50 MCG/0.5ML injection Inject into the muscle.   COVID-19 mRNA vaccine, Moderna, 100 MCG/0.5ML injection Inject into the muscle.   donepezil (ARICEPT) 5 MG tablet TAKE 1 TABLET BY MOUTH ONCE EVERY EVENING   Flaxseed, Linseed, (FLAX SEED OIL) 1000 MG CAPS Take 1,000 mg by mouth daily.   influenza vaccine adjuvanted (FLUAD) 0.5 ML injection Inject into the muscle.   metroNIDAZOLE (FLAGYL) 500 MG tablet Take 1 tablet (500 mg total) by mouth 2 (two) times daily.   pravastatin (PRAVACHOL) 40 MG tablet Take 40 mg by mouth at  bedtime.   Tetrahydrozoline HCl (VISINE OP) Apply 1 drop to eye daily as needed (dry eyes).   zolpidem (AMBIEN) 5 MG tablet Take 0.5 tablets by mouth at bedtime.    No facility-administered encounter medications on file as of 08/22/2023.    Review of Systems:  Review of Systems  Constitutional:  Negative for activity change, appetite change, chills, diaphoresis, fatigue, fever and unexpected weight change.  HENT:  Negative for congestion, sore throat and trouble swallowing.   Respiratory:  Negative for cough, shortness of breath and wheezing.   Cardiovascular:  Negative for chest pain, palpitations and leg swelling.  Gastrointestinal:  Negative for abdominal distention, abdominal pain, constipation and diarrhea.  Genitourinary:  Negative for difficulty urinating and dysuria.  Musculoskeletal:  Positive for arthralgias. Negative for back pain, gait problem, joint swelling and myalgias.  Neurological:  Positive for speech difficulty. Negative for dizziness, tremors, seizures, syncope, facial asymmetry, weakness, light-headedness, numbness and headaches.  Psychiatric/Behavioral:  Negative for agitation, behavioral problems and confusion.        Memory loss Angry about not being able to talk    Health Maintenance  Topic Date Due   Zoster Vaccines- Shingrix (1 of 2) Never done   Pneumonia Vaccine 45+ Years old (1 of 1 - PCV) Never done   DEXA SCAN  Never done   Medicare Annual Wellness (AWV)  11/16/2015   DTaP/Tdap/Td (2 - Td or Tdap) 07/09/2022   INFLUENZA VACCINE  06/09/2023   COVID-19 Vaccine (6 - 2023-24 season) 07/10/2023   HPV VACCINES  Aged Out    Physical Exam: Vitals:   08/22/23 1442  BP: 126/72  Pulse: 75  Resp: 17  Temp: 97.8 F (36.6 C)  TempSrc: Temporal  SpO2: 94%  Weight: 130 lb (59 kg)  Height: 5' 2.45" (1.586 m)   Body mass index is 23.44 kg/m. Physical Exam Vitals and nursing note reviewed.     Labs reviewed: Basic Metabolic Panel: No results for  input(s): "NA", "K", "CL", "CO2", "GLUCOSE", "BUN", "CREATININE", "CALCIUM", "MG", "PHOS", "TSH" in the last 8760 hours. Liver Function Tests: No results for input(s): "AST", "ALT", "ALKPHOS", "BILITOT", "PROT", "ALBUMIN" in the last 8760 hours. No results for input(s): "LIPASE", "AMYLASE" in the last 8760 hours. No results for input(s): "AMMONIA" in the last 8760 hours. CBC: No results for input(s): "WBC", "NEUTROABS", "HGB", "HCT", "MCV", "PLT" in the last 8760 hours. Lipid Panel: No results for input(s): "CHOL", "HDL", "LDLCALC", "TRIG", "CHOLHDL", "LDLDIRECT" in the last 8760 hours. No results found for: "HGBA1C"  Procedures since last visit: No results found.  Assessment/Plan  1. Mild cognitive impairment Prior hx of MCI in 2019 likely has progressed to dementia however with her primary language as [place] this may skew her  assessment.  She says she is not taking aricept, will need her to bring her pill bottles so we can clarify her regimen.  Recommend ST eval for cognition to see if she needs help managing her meds and/or assisted living and also to help with aphasia  2. Aphasia See #1 F/U with neurology, gave her their number to call for apt  3. Mixed hyperlipidemia Needs lipid panel  On statin  4. ADENOCARCINOMA, BREAST, HX OF Not longer getting mammograms per patient (last one in care everywhere in 2019)  5. Essential hypertension Normal today  In Perini's records she was on an ARB but it is not our med list She will need to bring her pill bottles to the next apt  6. B12 deficiency Was getting injections q 4 months per notes at Dr Perini's office.  Will check level, may need clinic nurse to help here at [facility].   7. Cerebrovascular disease With prior hx of TIA On statin and asa  Labs/tests ordered:  * No order type specified * CBC CMP B12 TSH lipid on 10/17 Next appt:   F/U with Dr Chales Abrahams in two weeks. MMSE prior to apt   Total time :  time greater  than 50% of total time spent doing pt counseling and coordination of care

## 2023-08-22 NOTE — Patient Instructions (Addendum)
Please Make an apt with [facility] [phone]  A speech referral has been made for aphasia (difficulty speaking)    Please come for blood work on Thursday 08/25/23  The clinic nurse will notify you about the blood work which is fasting.   Please bring your pills with your next apt with Dr Chales Abrahams.

## 2023-08-25 LAB — LIPID PANEL
Cholesterol: 181 (ref 0–200)
HDL: 47 (ref 35–70)
LDL Cholesterol: 117
LDl/HDL Ratio: 3.9
Triglycerides: 86 (ref 40–160)

## 2023-08-25 LAB — COMPREHENSIVE METABOLIC PANEL
Calcium: 9.5 (ref 8.7–10.7)
eGFR: 47

## 2023-08-25 LAB — CBC AND DIFFERENTIAL
HCT: 40 (ref 36–46)
Hemoglobin: 13 (ref 12.0–16.0)
Platelets: 411 10*3/uL — AB (ref 150–400)
WBC: 7.8

## 2023-08-25 LAB — BASIC METABOLIC PANEL
BUN: 19 (ref 4–21)
CO2: 26 — AB (ref 13–22)
Chloride: 102 (ref 99–108)
Creatinine: 1.2 — AB (ref 0.5–1.1)
Glucose: 80
Potassium: 4.6 meq/L (ref 3.5–5.1)
Sodium: 142 (ref 137–147)

## 2023-08-25 LAB — CBC: RBC: 4.2 (ref 3.87–5.11)

## 2023-08-25 NOTE — Addendum Note (Signed)
Addended by: Esmond Camper on: 08/25/2023 03:43 PM   Modules accepted: Orders

## 2023-08-30 ENCOUNTER — Encounter: Payer: Self-pay | Admitting: Physician Assistant

## 2023-09-06 ENCOUNTER — Encounter: Payer: Medicare Other | Admitting: Internal Medicine

## 2023-09-30 ENCOUNTER — Encounter: Payer: Self-pay | Admitting: Physician Assistant

## 2023-09-30 ENCOUNTER — Ambulatory Visit: Payer: Medicare Other | Admitting: Physician Assistant

## 2023-09-30 VITALS — BP 122/59 | HR 66 | Resp 20 | Ht 62.0 in | Wt 130.0 lb

## 2023-09-30 DIAGNOSIS — R4701 Aphasia: Secondary | ICD-10-CM

## 2023-09-30 NOTE — Progress Notes (Cosign Needed)
Assessment/Plan:   Kathleen Neal is a very pleasant 87 y.o. year old RH  [place] female with a history of hypertension, hyperlipidemia, insomnia, B12 deficiency, history of carotid artery stenosis, history of TIA in 2015, history of breast cancer status postlumpectomy, chemo and radiation, chemo induced neuropathy, arthritis, history of Barrett's esophagus seen today for evaluation of memory loss. Unable to perform MoCA or MMSE due to   expressive and receptive speech difficulty concerning for PPA.  She was initially seen in March 2019 with memory complaints, at the time with the MoCA of 21/30, diagnosed with mild cognitive impairment, then followed by PCP. She tried memantine and galantamine but had poor tolerance to both. Currently she is on memantine 5 mg bid, tolerating well.   She is on donepezil 5 mg daily by PCP, tolerating well.    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 Recommend referral to ST for possible PPA Continue donepezil recommend increasing to  10 mg daily by PCP, side effects discussed Continue to control mood as per PCP Recommend good control of cardiovascular risk factors Fol[place]ow up in 3 months   Subjective:   The patient is accompanied by her nurse (health Care Advocate)  who supplements the history.   How long did patient have memory difficulties? "It is not so much the memory as is my speech" " for the last year I cannot say the word I am thinking, getting worse" . She may use some [place] at times. LTM is good.  repeats oneself?  only when she wants to make a point, it disturbs her when she is trying to say something. She can express herself in writing, reads extensively.  Disoriented when walking into a room?  Patient denies Leaving objects in unusual places?  She misplaced her wallet behind the TV, not in unusual  places.   Wandering behavior?  denies .  Any personality changes?  She is more irritability because of her speech which resulted in social isolation.  Any history of depression?:  Denies   Hallucinations or paranoia?  Denies   Seizures?  Denies    Any sleep changes?   Does not sleep well, "it takes me a long time". Denies vivid dreams, REM behavior or sleepwalking.  Was taking Ambien till recently. Sleep apnea?  Denies   Any hygiene concerns?  Denies   Independent of bathing and dressing?  Endorsed  Does the patient needs help with medications?  RN is the health care advocate.   Who is in charge of the finances? Financial advisor Any changes in appetite?  Denies  Patient have trouble swallowing? Denies.   Does the patient cook? No  Any kitchen accidents such as leaving the stove on? Denies.   Any history of headaches?   Denies.   Chronic pain ? Denies.   Ambulates with difficulty?  Denies.   Recent falls or head injuries? Denies.   Vision changes? Denies.   Unilateral weakness, numbness or tingling? Denies.   Any tremors?   Denies.   Any anosmia?  Denies.   Any incontinence of urine? Denies.   Any bowel dysfunction? Denies.      Patient lives [place] [facility].    History of heavy alcohol intake? 1 glass a day of wine History of heavy tobacco use? Denies.   Family history of dementia? Denies.  Does patient drive? Recently stopped  driving     Past Medical History:  Diagnosis Date   ADENOCARCINOMA, BREAST, HX OF 02/06/2008   Qualifier: Diagnosis of   By: Misty Stanley CMA (AAMA), Amanda         Aphasia 08/22/2023   Barrett's esophagus 2014   Cataracts, bilateral    Cerebrovascular disease 08/22/2023   Essential hypertension 08/22/2023   GERD (gastroesophageal reflux disease)    occasional - diet controlled   Headache    occasional   Hearing loss    left   Hyperlipidemia    lt breast ca dx'd 2003   chemo/xrt comp/ left   Mild cognitive impairment 08/22/2023   Neuropathy     both feet      Past Surgical History:  Procedure Laterality Date   BREAST LUMPECTOMY  2003   left - bx   COLONOSCOPY     last colon 2005   DILATATION & CURETTAGE/HYSTEROSCOPY WITH MYOSURE N/A 02/14/2017   Procedure: DILATATION & CURETTAGE/HYSTEROSCOPY WITH MYOSURE;  Surgeon: Romualdo Bolk, MD;  Location: [facility];  Service: Gynecology;  Laterality: N/A;  follow first case   Left knee  2014   meniscus   POLYPECTOMY  2000   UPPER GI ENDOSCOPY       Allergies  Allergen Reactions   Codeine Nausea And Vomiting   Galantamine Other (See Comments)    Felt woozy even after taking for one month   Namenda [Memantine] Other (See Comments)    Nightmares    Current Outpatient Medications  Medication Instructions   acetaminophen (TYLENOL) 325 mg, Oral, Daily PRN   aspirin 81 mg, Oral, Daily   Biotin 5,000 mcg, Oral, Daily   Cholecalciferol (VITAMIN D PO) 1 capsule, Oral, Daily   COVID-19 mRNA bivalent vaccine, Moderna, (MODERNA COVID-19 BIVAL BOOSTER) 50 MCG/0.5ML injection Intramuscular   COVID-19 mRNA vaccine, Moderna, 100 MCG/0.5ML injection Intramuscular   donepezil (ARICEPT) 5 MG tablet TAKE 1 TABLET BY MOUTH ONCE EVERY EVENING   Flax Seed Oil 1,000 mg, Oral, Daily   influenza vaccine adjuvanted (FLUAD) 0.5 ML injection Intramuscular   metroNIDAZOLE (FLAGYL) 500 mg, Oral, 2 times daily   pravastatin (PRAVACHOL) 40 mg, Oral, Daily at bedtime   Tetrahydrozoline HCl (VISINE OP) 1 drop, Ophthalmic, Daily PRN   zolpidem (AMBIEN) 5 MG tablet 0.5 tablets, Oral, Daily at bedtime     VITALS:   Vitals:   09/30/23 0958  BP: (!) 122/59  Pulse: 66  Resp: 20  SpO2: 95%  Weight: 130 lb (59 kg)  Height: 5\' 2"  (1.575 m)      PHYSICAL EXAM   HEENT:  Normocephalic, atraumatic. The superficial temporal arteries are without ropiness or tenderness. Cardiovascular: Regular rate and rhythm. Lungs: Clear to auscultation bilaterally. Neck: There are no carotid bruits noted  bilaterally.  NEUROLOGICAL:    01/16/2018    1:55 PM  Montreal Cognitive Assessment   Visuospatial/ Executive (0/5) 4  Naming (0/3) 3  Attention: Read list of digits (0/2) 2  Attention: Read list of letters (0/1) 1  Attention: Serial 7 subtraction starting at 100 (0/3) 2  Language: Repeat phrase (0/2) 2  Language : Fluency (0/1) 0  Abstraction (0/2) 2  Delayed Recall (0/5) 0  Orientation (0/6) 5  Total 21  Adjusted Score (based on education) 21        No data to display           Orientation:  Alert and oriented to person,  not to place and time. Noted aphasia, no dysarthria. Fund  of knowledge is appropriate. Recent memory impaired and remote memory intact.  Attention and concentration are normal.  Able to name objects and  unable to repeat phrases.Unable to test delayed recall  Cranial nerves: There is good facial symmetry. Extraocular muscles are intact and visual fields are full to confrontational testing. Speech is non fluent, truncated at times, intermittently tangential  No tongue deviation. Hearing is intact to conversational tone. Tone: Tone is good throughout. Sensation: Sensation is intact to light touch. Vibration is decreased at the bilateral big toe.  Coordination: The patient has some difficulty with RAM's, normal  FNF bilaterally. Normal finger to nose  Motor: Strength is 5/5 in the bilateral upper and lower extremities. There is no pronator drift. There are no fasciculations noted. DTR's: Deep tendon reflexes are 2/4 bilaterally. Gait and Station: The patient is able to ambulate without difficulty The patient is able to heel toe walk . Gait is cautious and narrow. The patient is able to ambulate in a tandem fashion.       Thank you for allowing [place] the opportunity to participate in the care of this nice patient. Please do not hesitate to contact us for any questions or concerns.   Total time spent on today's visit was 60 minutes dedicated to this patient today,  preparing to see patient, examining the patient, ordering tests and/or medications and counseling the patient, documenting clinical information in the EHR or other health record, independently interpreting results and communicating results to the patient/family, discussing treatment and goals, answering patient's questions and coordinating care.  Cc:  Rodrigo Ran, MD  Marlowe Kays 09/30/2023 12:36 PM

## 2023-09-30 NOTE — Patient Instructions (Addendum)
It was a pleasure to see you today at our office.   Recommendations:   MRI of the brain, the radiology office will call you to arrange you appointment  [phone] Continue donepezil, recommend increasing to 10 mg daily  Referral to speech therapy for expressive aphasia homehealth Follow up March 14 at 11:30     For psychiatric meds, mood meds: Please have your primary care physician manage these medications.  If you have any severe symptoms of a stroke, or other severe issues such as confusion,severe chills or fever, etc call 911 or go to the ER as you may need to be evaluated further    For assessment of decision of mental capacity and competency:  Call Dr. Erick Blinks, geriatric psychiatrist at [phone]  Counseling regarding caregiver distress, including caregiver depression, anxiety and issues regarding community resources, adult day care programs, adult living facilities, or memory care questions:  please contact your  Primary Doctor's Social Worker   Whom to call: Memory  decline, memory medications: Call our office [phone]    [email or web address]   RECOMMENDATIONS FOR ALL PATIENTS WITH MEMORY PROBLEMS: 1. Continue to exercise (Recommend 30 minutes of walking everyday, or 3 hours every week) 2. Increase social interactions - continue going to [place] and enjoy social gatherings with friends and family 3. Eat healthy, avoid fried foods and eat more fruits and vegetables 4. Maintain adequate blood pressure, blood sugar, and blood cholesterol level. Reducing the risk of stroke and cardiovascular disease also helps promoting better memory. 5. Avoid stressful situations. Live a simple life and avoid aggravations. Organize your time and prepare for the next day in anticipation. 6. Sleep well, avoid any interruptions of sleep and avoid any distractions in the bedroom that may interfere with adequate sleep quality 7. Avoid  sugar, avoid sweets as there is a strong link between excessive sugar intake, diabetes, and cognitive impairment We discussed the Mediterranean diet, which has been shown to help patients reduce the risk of progressive memory disorders and reduces cardiovascular risk. This includes eating fish, eat fruits and green leafy vegetables, nuts like almonds and hazelnuts, walnuts, and also use olive oil. Avoid fast foods and fried foods as much as possible. Avoid sweets and sugar as sugar use has been linked to worsening of memory function.  There is always a concern of gradual progression of memory problems. If this is the case, then we may need to adjust level of care according to patient needs. Support, both to the patient and caregiver, should then be put into place.          FALL PRECAUTIONS: Be cautious when walking. Scan the area for obstacles that may increase the risk of trips and falls. When getting up in the mornings, sit up at the edge of the bed for a few minutes before getting out of bed. Consider elevating the bed at the head end to avoid drop of blood pressure when getting up. Walk always in a well-lit room (use night lights in the walls). Avoid area rugs or power cords from appliances in the middle of the walkways. Use a walker or a cane if necessary and consider physical therapy for balance exercise. Get your eyesight checked regularly.  FINANCIAL OVERSIGHT: Supervision, especially oversight when making financial decisions or transactions is also recommended.  HOME SAFETY: Consider the safety of the kitchen when operating appliances like stoves, microwave oven, and blender. Consider having supervision and share cooking responsibilities until no longer able to participate in those. Accidents  with firearms and other hazards in the house should be identified and addressed as well.   ABILITY TO BE LEFT ALONE: If patient is unable to contact 911 operator, consider using LifeLine, or when the  need is there, arrange for someone to stay with patients. Smoking is a fire hazard, consider supervision or cessation. Risk of wandering should be assessed by caregiver and if detected at any point, supervision and safe proof recommendations should be instituted.  MEDICATION SUPERVISION: Inability to self-administer medication needs to be constantly addressed. Implement a mechanism to ensure safe administration of the medications.      Mediterranean Diet A Mediterranean diet refers to food and lifestyle choices that are based on the traditions of countries located on the [employer] [place]. This way of eating has been shown to help prevent certain conditions and improve outcomes for people who have chronic diseases, like kidney disease and heart disease. What are tips for following this plan? Lifestyle  Cook and eat meals together with your family, when possible. Drink enough fluid to keep your urine clear or pale yellow. Be physically active every day. This includes: Aerobic exercise like running or swimming. Leisure activities like gardening, walking, or housework. Get 7-8 hours of sleep each night. If recommended by your health care provider, drink red wine in moderation. This means 1 glass a day for nonpregnant women and 2 glasses a day for men. A glass of wine equals 5 oz (150 mL). Reading food labels  Check the serving size of packaged foods. For foods such as rice and pasta, the serving size refers to the amount of cooked product, not dry. Check the total fat in packaged foods. Avoid foods that have saturated fat or trans fats. Check the ingredients list for added sugars, such as corn syrup. Shopping  At the grocery store, buy most of your food from the areas near the walls of the store. This includes: Fresh fruits and vegetables (produce). Grains, beans, nuts, and seeds. Some of these may be available in unpackaged forms or large amounts (in bulk). Fresh seafood. Poultry and  eggs. Low-fat dairy products. Buy whole ingredients instead of prepackaged foods. Buy fresh fruits and vegetables in-season from local farmers markets. Buy frozen fruits and vegetables in resealable bags. If you do not have access to quality fresh seafood, buy precooked frozen shrimp or canned fish, such as tuna, salmon, or sardines. Buy small amounts of raw or cooked vegetables, salads, or olives from the deli or salad bar at your store. Stock your pantry so you always have certain foods on hand, such as olive oil, canned tuna, canned tomatoes, rice, pasta, and beans. Cooking  Cook foods with extra-virgin olive oil instead of using butter or other vegetable oils. Have meat as a side dish, and have vegetables or grains as your main dish. This means having meat in small portions or adding small amounts of meat to foods like pasta or stew. Use beans or vegetables instead of meat in common dishes like chili or lasagna. Experiment with different cooking methods. Try roasting or broiling vegetables instead of steaming or sauteing them. Add frozen vegetables to soups, stews, pasta, or rice. Add nuts or seeds for added healthy fat at each meal. You can add these to yogurt, salads, or vegetable dishes. Marinate fish or vegetables using olive oil, lemon juice, garlic, and fresh herbs. Meal planning  Plan to eat 1 vegetarian meal one day each week. Try to work up to 2 vegetarian meals, if possible. Eat  seafood 2 or more times a week. Have healthy snacks readily available, such as: Vegetable sticks with hummus. Greek yogurt. Fruit and nut trail mix. Eat balanced meals throughout the week. This includes: Fruit: 2-3 servings a day Vegetables: 4-5 servings a day Low-fat dairy: 2 servings a day Fish, poultry, or lean meat: 1 serving a day Beans and legumes: 2 or more servings a week Nuts and seeds: 1-2 servings a day Whole grains: 6-8 servings a day Extra-virgin olive oil: 3-4 servings a day Limit  red meat and sweets to only a few servings a month What are my food choices? Mediterranean diet Recommended Grains: Whole-grain pasta. Brown rice. Bulgar wheat. Polenta. Couscous. Whole-wheat bread. Orpah Cobb. Vegetables: Artichokes. Beets. Broccoli. Cabbage. Carrots. Eggplant. Green beans. Chard. Kale. Spinach. Onions. Leeks. Peas. Squash. Tomatoes. Peppers. Radishes. Fruits: Apples. Apricots. Avocado. Berries. Bananas. Cherries. Dates. Figs. Grapes. Lemons. Melon. Oranges. Peaches. Plums. Pomegranate. Meats and other protein foods: Beans. Almonds. Sunflower seeds. Pine nuts. Peanuts. Cod. Salmon. Scallops. Shrimp. Tuna. Tilapia. Clams. Oysters. Eggs. Dairy: Low-fat milk. Cheese. Greek yogurt. Beverages: Water. Red wine. Herbal tea. Fats and oils: Extra virgin olive oil. Avocado oil. Grape seed oil. Sweets and desserts: [place] yogurt with honey. Baked apples. Poached pears. Trail mix. Seasoning and other foods: Basil. Cilantro. Coriander. Cumin. Mint. Parsley. Sage. Rosemary. Tarragon. Garlic. Oregano. Thyme. Pepper. Balsalmic vinegar. Tahini. Hummus. Tomato sauce. Olives. Mushrooms. Limit these Grains: Prepackaged pasta or rice dishes. Prepackaged cereal with added sugar. Vegetables: Deep fried potatoes (french fries). Fruits: Fruit canned in syrup. Meats and other protein foods: Beef. Pork. Lamb. Poultry with skin. Hot dogs. Tomasa Blase. Dairy: Ice cream. Sour cream. Whole milk. Beverages: Juice. Sugar-sweetened soft drinks. Beer. Liquor and spirits. Fats and oils: Butter. Canola oil. Vegetable oil. Beef fat (tallow). Lard. Sweets and desserts: Cookies. Cakes. Pies. Candy. Seasoning and other foods: Mayonnaise. Premade sauces and marinades. The items listed may not be a complete list. Talk with your dietitian about what dietary choices are right for you. Summary The Mediterranean diet includes both food and lifestyle choices. Eat a variety of fresh fruits and vegetables, beans, nuts,  seeds, and whole grains. Limit the amount of red meat and sweets that you eat. Talk with your health care provider about whether it is safe for you to drink red wine in moderation. This means 1 glass a day for nonpregnant women and 2 glasses a day for men. A glass of wine equals 5 oz (150 mL). This information is not intended to replace advice given to you by your health care provider. Make sure you discuss any questions you have with your health care provider. Document Released: 06/17/2016 Document Revised: 07/20/2016 Document Reviewed: 06/17/2016 Elsevier Interactive Patient Education  2017 [employer].

## 2023-10-04 ENCOUNTER — Encounter: Payer: Self-pay | Admitting: Physician Assistant

## 2023-10-04 ENCOUNTER — Telehealth: Payer: Self-pay

## 2023-10-04 NOTE — Telephone Encounter (Signed)
Donnamarie Poag is calling from [facility] to get verbal orders for patient for speech thereapy. The orders are 1 week 6. She can be reached at [phone].

## 2023-10-04 NOTE — Telephone Encounter (Signed)
I did not order therapy through this group. In review of the chart this came from the neurologist so they would need to contact them.

## 2023-10-25 ENCOUNTER — Ambulatory Visit
Admission: RE | Admit: 2023-10-25 | Discharge: 2023-10-25 | Disposition: A | Discharge status: Home / Self Care | Payer: Medicare Other | Source: Ambulatory Visit | Attending: Physician Assistant | Admitting: Physician Assistant

## 2023-11-01 ENCOUNTER — Encounter: Payer: Self-pay | Admitting: Physician Assistant

## 2023-11-03 NOTE — Progress Notes (Signed)
MRI brain  circulation is similar to  he images from 5 years ago. Some age related changes, atrophy noted as well. No acute findings Thanks

## 2023-11-28 ENCOUNTER — Encounter: Payer: Self-pay | Admitting: Physician Assistant

## 2023-12-14 ENCOUNTER — Telehealth: Payer: Self-pay | Admitting: Physician Assistant

## 2023-12-14 ENCOUNTER — Telehealth: Payer: Self-pay

## 2023-12-14 NOTE — Telephone Encounter (Signed)
 Needs letter of competency due to financial affairs. Please advise to Amalia Badder whom is POA.

## 2023-12-14 NOTE — Telephone Encounter (Signed)
 Left message to return call so I can give [facility] number to patient.

## 2023-12-14 NOTE — Telephone Encounter (Signed)
Kathleen Neal is calling to speak with someone about Kathleen Neal. Kathleen Neal said there should be POA forms here that allow her to speak.

## 2024-01-20 ENCOUNTER — Ambulatory Visit: Payer: Medicare Other | Admitting: Physician Assistant

## 2024-01-20 ENCOUNTER — Encounter: Payer: Self-pay | Admitting: Physician Assistant

## 2024-01-20 VITALS — BP 108/61 | HR 73 | Resp 20

## 2024-01-20 DIAGNOSIS — R4701 Aphasia: Secondary | ICD-10-CM

## 2024-01-20 DIAGNOSIS — R413 Other amnesia: Secondary | ICD-10-CM

## 2024-01-20 NOTE — Progress Notes (Signed)
 Assessment/Plan:   Memory Impairment, concern for vascular and Alzheimer's disease Speech difficulty, concern for PPA.  Kathleen Neal is a delightful 88 y.o. RH female from [place] with a history of hypertension, hyperlipidemia, insomnia, B12 deficiency, history of carotid artery stenosis, history of TIA in 2015, history of breast cancer status post lumpectomy, chemo and radiation, chemo induced neuropathy, arthritis, history of Barrett's esophagus  presenting today in follow-up for evaluation of memory loss.  Due to expressive and receptive speech difficulty concerning for PPA. MoCA or MMSE  was unable to be performed, although memory appears stable.  Patient is on donepezil 10 mg daily by PCP tolerating well.  Patient is able to participate on ADLs  and she has a driver to take her to places safely.       Recommendations:   Follow up in  6 months. Continue donepezil 10 mg daily, side effects discussed Continue speech therapy for expressive aphasia Recommend good control of cardiovascular risk factors. Continue to control mood as per PCP   Subjective:   This patient is accompanied in the office by her nurse (healthcare advocate)  who supplements the history. Previous records as well as any outside records available were reviewed prior to todays visit.   Patient was last seen on  09/30/23    Any changes in memory since last visit? "  Memory stable, but her main concern is "my speech, cannot say the words I am thinking ".  She may use intermittent words at times.  LTM is good.  She can express herself well in writing, read extensively. ST was not successful, and she was discharged because of "frustration". She asked to  try with a different provider, and this was denied.  repeats oneself?  "Only when she wants to make appointment ". Disoriented when walking into a room?  Patient denies    Misplacing objects?  May misplace things but not in unusual places.  Wandering behavior?    denies   Any personality changes since last visit?  She has moments of irritability because of her speech.  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 Any worsening depression?: denies.    Hallucinations or paranoia?  Denies.   Seizures?   denies    Any sleep changes? Sleeps well.  Denies vivid dreams, REM behavior or sleepwalking.    Sleep apnea?   Denies.    Any hygiene concerns?   denies   Independent of bathing and dressing?  Endorsed  Does the patient needs help with medications?  Healthcare advocate is in charge  Who is in charge of the finances?  She has a Firefighter who is in charge of it.  Any changes in appetite?  Denies. Does not drink enough water.      Patient have trouble swallowing?  denies   Does the patient cook?  No   Any headaches?  Her nurse reports that "anxiety going to the brain, then to the mouth and then she has a headache "-nurses.  Headaches are located on the left parietal area and they are not severe Vision changes? denies Chronic pain?  denies   Ambulates with difficulty? L knee arthritis which may cause discomfort. . Recent falls or head injuries?    denies      Unilateral weakness, numbness or tingling?   denies   Any tremors?  denies   Any  anosmia?    denies   Any incontinence of urine?  denies   Any bowel dysfunction?  denies      Patient lives at [address] [place]  Does the patient drive?  She has a driver for mobility.   MRI of the brain, personally reviewed, 11/01/2023 without evidence of acute intracranial abnormality, remarkable for chronic small vessel ischemic changes within the cerebral white matter and pons, moderate in severity.  These findings are similar to the prior MRI of the brain in December 2018.  There is mild to moderate generalized cerebral atrophy  Past Medical History:  Diagnosis Date   ADENOCARCINOMA, BREAST, HX OF  02/06/2008   Qualifier: Diagnosis of   By: Misty Stanley CMA (AAMA), Amanda         Aphasia 08/22/2023   Barrett's esophagus 2014   Cataracts, bilateral    Cerebrovascular disease 08/22/2023   Essential hypertension 08/22/2023   GERD (gastroesophageal reflux disease)    occasional - diet controlled   Headache    occasional   Hearing loss    left   Hyperlipidemia    lt breast ca dx'd 2003   chemo/xrt comp/ left   Mild cognitive impairment 08/22/2023   Neuropathy    both feet      Past Surgical History:  Procedure Laterality Date   BREAST LUMPECTOMY  2003   left - bx   COLONOSCOPY     last colon 2005   DILATATION & CURETTAGE/HYSTEROSCOPY WITH MYOSURE N/A 02/14/2017   Procedure: DILATATION & CURETTAGE/HYSTEROSCOPY WITH MYOSURE;  Surgeon: Romualdo Bolk, MD;  Location: [facility];  Service: Gynecology;  Laterality: N/A;  follow first case   Left knee  2014   meniscus   POLYPECTOMY  2000   UPPER GI ENDOSCOPY       PREVIOUS MEDICATIONS: Memantine and galantamine  CURRENT MEDICATIONS:  Outpatient Encounter Medications as of 01/20/2024  Medication Sig   acetaminophen (TYLENOL) 325 MG tablet Take 325 mg by mouth daily as needed for moderate pain or headache.   Biotin 5000 MCG CAPS Take 5,000 mcg by mouth daily.   Cholecalciferol (VITAMIN D PO) Take 1 capsule by mouth daily.    COVID-19 mRNA bivalent vaccine, Moderna, (MODERNA COVID-19 BIVAL BOOSTER) 50 MCG/0.5ML injection Inject into the muscle.   COVID-19 mRNA vaccine, Moderna, 100 MCG/0.5ML injection Inject into the muscle.   influenza vaccine adjuvanted (FLUAD) 0.5 ML injection Inject into the muscle.   Tetrahydrozoline HCl (VISINE OP) Apply 1 drop to eye daily as needed (dry eyes).   aspirin 81 MG tablet Take 81 mg by mouth daily.    donepezil (ARICEPT) 5 MG tablet TAKE 1 TABLET BY MOUTH ONCE EVERY EVENING   Flaxseed, Linseed, (FLAX SEED OIL) 1000 MG CAPS Take 1,000 mg by mouth daily.   metroNIDAZOLE (FLAGYL) 500 MG tablet Take  1 tablet (500 mg total) by mouth 2 (two) times daily.   pravastatin (PRAVACHOL) 40 MG tablet Take 40 mg by mouth at bedtime.   zolpidem (AMBIEN) 5 MG tablet Take 0.5 tablets by mouth at bedtime.    No facility-administered encounter medications on file as of 01/20/2024.     Objective:     PHYSICAL EXAMINATION:    VITALS:   Vitals:   01/20/24 1133  BP: 108/61  Pulse: 73  Resp: 20  SpO2: 95%    GEN:  The patient appears stated age and is in NAD. HEENT:  Normocephalic, atraumatic.   Neurological examination:  General: NAD, well-groomed, appears stated age. Orientation:  The patient is alert. Oriented to person, not to place and date Cranial nerves: There is good facial symmetry.The speech is not fluent, truncated at times, intermittently tangential, but clear.  She has aphasia, no dysarthria. Fund of knowledge is appropriate. Recent memory impaired and remote memory is normal, repetitive at times.  Attention and concentration are normal.  Able to name objects and unable to repeat phrases.  Hearing is intact to conversational tone.  Sensation: Sensation is intact to light touch throughout Motor: Strength is at least antigravity x4. DTR's 2/4 in UE/LE      01/16/2018    1:55 PM  Montreal Cognitive Assessment   Visuospatial/ Executive (0/5) 4  Naming (0/3) 3  Attention: Read list of digits (0/2) 2  Attention: Read list of letters (0/1) 1  Attention: Serial 7 subtraction starting at 100 (0/3) 2  Language: Repeat phrase (0/2) 2  Language : Fluency (0/1) 0  Abstraction (0/2) 2  Delayed Recall (0/5) 0  Orientation (0/6) 5  Total 21  Adjusted Score (based on education) 21        No data to display             Movement examination: Tone: There is normal tone in the UE/LE Abnormal movements:  no tremor.  No myoclonus.  No asterixis.   Coordination:  There is an difficulty with RAM's, normal AF and AF bilaterally. Normal finger to nose  Gait and Station: The patient has  no difficulty arising out of a deep-seated chair without the use of the hands. The patient's stride length is good.  Gait is cautious and narrow.   Thank you for allowing [place] the opportunity to participate in the care of this nice patient. Please do not hesitate to contact us for any questions or concerns.   Total time spent on today's visit was 42 minutes dedicated to this patient today, preparing to see patient, examining the patient, ordering tests and/or medications and counseling the patient, documenting clinical information in the EHR or other health record, independently interpreting results and communicating results to the patient/family, discussing treatment and goals, answering patient's questions and coordinating care.  Cc:  Rodrigo Ran, MD  Marlowe Kays 01/20/2024 11:43 AM

## 2024-01-20 NOTE — Patient Instructions (Addendum)
 It was a pleasure to see you today at our office.   Recommendations:    Continue donepezil, 10 mg daily  Follow up September 15 at 11:30 am Please discuss anxiety with the family doctor    For psychiatric meds, mood meds: Please have your primary care physician manage these medications.  If you have any severe symptoms of a stroke, or other severe issues such as confusion,severe chills or fever, etc call 911 or go to the ER as you may need to be evaluated further    For assessment of decision of mental capacity and competency:  Call Dr. Erick Blinks, geriatric psychiatrist at [phone]  Counseling regarding caregiver distress, including caregiver depression, anxiety and issues regarding community resources, adult day care programs, adult living facilities, or memory care questions:  please contact your  Primary Doctor's Social Worker   Whom to call: Memory  decline, memory medications: Call our office [phone]    [email or web address]   RECOMMENDATIONS FOR ALL PATIENTS WITH MEMORY PROBLEMS: 1. Continue to exercise (Recommend 30 minutes of walking everyday, or 3 hours every week) 2. Increase social interactions - continue going to [place] and enjoy social gatherings with friends and family 3. Eat healthy, avoid fried foods and eat more fruits and vegetables 4. Maintain adequate blood pressure, blood sugar, and blood cholesterol level. Reducing the risk of stroke and cardiovascular disease also helps promoting better memory. 5. Avoid stressful situations. Live a simple life and avoid aggravations. Organize your time and prepare for the next day in anticipation. 6. Sleep well, avoid any interruptions of sleep and avoid any distractions in the bedroom that may interfere with adequate sleep quality 7. Avoid sugar, avoid sweets as there is a strong link between excessive sugar intake, diabetes, and cognitive impairment We  discussed the Mediterranean diet, which has been shown to help patients reduce the risk of progressive memory disorders and reduces cardiovascular risk. This includes eating fish, eat fruits and green leafy vegetables, nuts like almonds and hazelnuts, walnuts, and also use olive oil. Avoid fast foods and fried foods as much as possible. Avoid sweets and sugar as sugar use has been linked to worsening of memory function.  There is always a concern of gradual progression of memory problems. If this is the case, then we may need to adjust level of care according to patient needs. Support, both to the patient and caregiver, should then be put into place.          FALL PRECAUTIONS: Be cautious when walking. Scan the area for obstacles that may increase the risk of trips and falls. When getting up in the mornings, sit up at the edge of the bed for a few minutes before getting out of bed. Consider elevating the bed at the head end to avoid drop of blood pressure when getting up. Walk always in a well-lit room (use night lights in the walls). Avoid area rugs or power cords from appliances in the middle of the walkways. Use a walker or a cane if necessary and consider physical therapy for balance exercise. Get your eyesight checked regularly.  FINANCIAL OVERSIGHT: Supervision, especially oversight when making financial decisions or transactions is also recommended.  HOME SAFETY: Consider the safety of the kitchen when operating appliances like stoves, microwave oven, and blender. Consider having supervision and share cooking responsibilities until no longer able to participate in those. Accidents with firearms and other hazards in the house should be identified and addressed as well.   ABILITY TO  BE LEFT ALONE: If patient is unable to contact 911 operator, consider using LifeLine, or when the need is there, arrange for someone to stay with patients. Smoking is a fire hazard, consider supervision or cessation.  Risk of wandering should be assessed by caregiver and if detected at any point, supervision and safe proof recommendations should be instituted.  MEDICATION SUPERVISION: Inability to self-administer medication needs to be constantly addressed. Implement a mechanism to ensure safe administration of the medications.      Mediterranean Diet A Mediterranean diet refers to food and lifestyle choices that are based on the traditions of countries located on the [employer]. This way of eating has been shown to help prevent certain conditions and improve outcomes for people who have chronic diseases, like kidney disease and heart disease. What are tips for following this plan? Lifestyle  Cook and eat meals together with your family, when possible. Drink enough fluid to keep your urine clear or pale yellow. Be physically active every day. This includes: Aerobic exercise like running or swimming. Leisure activities like gardening, walking, or housework. Get 7-8 hours of sleep each night. If recommended by your health care provider, drink red wine in moderation. This means 1 glass a day for nonpregnant women and 2 glasses a day for men. A glass of wine equals 5 oz (150 mL). Reading food labels  Check the serving size of packaged foods. For foods such as rice and pasta, the serving size refers to the amount of cooked product, not dry. Check the total fat in packaged foods. Avoid foods that have saturated fat or trans fats. Check the ingredients list for added sugars, such as corn syrup. Shopping  At the grocery store, buy most of your food from the areas near the walls of the store. This includes: Fresh fruits and vegetables (produce). Grains, beans, nuts, and seeds. Some of these may be available in unpackaged forms or large amounts (in bulk). Fresh seafood. Poultry and eggs. Low-fat dairy products. Buy whole ingredients instead of prepackaged foods. Buy fresh fruits and vegetables  in-season from local farmers markets. Buy frozen fruits and vegetables in resealable bags. If you do not have access to quality fresh seafood, buy precooked frozen shrimp or canned fish, such as tuna, salmon, or sardines. Buy small amounts of raw or cooked vegetables, salads, or olives from the deli or salad bar at your store. Stock your pantry so you always have certain foods on hand, such as olive oil, canned tuna, canned tomatoes, rice, pasta, and beans. Cooking  Cook foods with extra-virgin olive oil instead of using butter or other vegetable oils. Have meat as a side dish, and have vegetables or grains as your main dish. This means having meat in small portions or adding small amounts of meat to foods like pasta or stew. Use beans or vegetables instead of meat in common dishes like chili or lasagna. Experiment with different cooking methods. Try roasting or broiling vegetables instead of steaming or sauteing them. Add frozen vegetables to soups, stews, pasta, or rice. Add nuts or seeds for added healthy fat at each meal. You can add these to yogurt, salads, or vegetable dishes. Marinate fish or vegetables using olive oil, lemon juice, garlic, and fresh herbs. Meal planning  Plan to eat 1 vegetarian meal one day each week. Try to work up to 2 vegetarian meals, if possible. Eat seafood 2 or more times a week. Have healthy snacks readily available, such as: Vegetable sticks with hummus. [place]  yogurt. Fruit and nut trail mix. Eat balanced meals throughout the week. This includes: Fruit: 2-3 servings a day Vegetables: 4-5 servings a day Low-fat dairy: 2 servings a day Fish, poultry, or lean meat: 1 serving a day Beans and legumes: 2 or more servings a week Nuts and seeds: 1-2 servings a day Whole grains: 6-8 servings a day Extra-virgin olive oil: 3-4 servings a day Limit red meat and sweets to only a few servings a month What are my food choices? Mediterranean  diet Recommended Grains: Whole-grain pasta. Brown rice. Bulgar wheat. Polenta. Couscous. Whole-wheat bread. Orpah Cobb. Vegetables: Artichokes. Beets. Broccoli. Cabbage. Carrots. Eggplant. Green beans. Chard. Kale. Spinach. Onions. Leeks. Peas. Squash. Tomatoes. Peppers. Radishes. Fruits: Apples. Apricots. Avocado. Berries. Bananas. Cherries. Dates. Figs. Grapes. Lemons. Melon. Oranges. Peaches. Plums. Pomegranate. Meats and other protein foods: Beans. Almonds. Sunflower seeds. Pine nuts. Peanuts. Cod. Salmon. Scallops. Shrimp. Tuna. Tilapia. Clams. Oysters. Eggs. Dairy: Low-fat milk. Cheese. Greek yogurt. Beverages: Water. Red wine. Herbal tea. Fats and oils: Extra virgin olive oil. Avocado oil. Grape seed oil. Sweets and desserts: [place] yogurt with honey. Baked apples. Poached pears. Trail mix. Seasoning and other foods: Basil. Cilantro. Coriander. Cumin. Mint. Parsley. Sage. Rosemary. Tarragon. Garlic. Oregano. Thyme. Pepper. Balsalmic vinegar. Tahini. Hummus. Tomato sauce. Olives. Mushrooms. Limit these Grains: Prepackaged pasta or rice dishes. Prepackaged cereal with added sugar. Vegetables: Deep fried potatoes (french fries). Fruits: Fruit canned in syrup. Meats and other protein foods: Beef. Pork. Lamb. Poultry with skin. Hot dogs. Tomasa Blase. Dairy: Ice cream. Sour cream. Whole milk. Beverages: Juice. Sugar-sweetened soft drinks. Beer. Liquor and spirits. Fats and oils: Butter. Canola oil. Vegetable oil. Beef fat (tallow). Lard. Sweets and desserts: Cookies. Cakes. Pies. Candy. Seasoning and other foods: Mayonnaise. Premade sauces and marinades. The items listed may not be a complete list. Talk with your dietitian about what dietary choices are right for you. Summary The Mediterranean diet includes both food and lifestyle choices. Eat a variety of fresh fruits and vegetables, beans, nuts, seeds, and whole grains. Limit the amount of red meat and sweets that you eat. Talk with your  health care provider about whether it is safe for you to drink red wine in moderation. This means 1 glass a day for nonpregnant women and 2 glasses a day for men. A glass of wine equals 5 oz (150 mL). This information is not intended to replace advice given to you by your health care provider. Make sure you discuss any questions you have with your health care provider. Document Released: 06/17/2016 Document Revised: 07/20/2016 Document Reviewed: 06/17/2016 Elsevier Interactive Patient Education  2017 [employer].

## 2024-01-25 ENCOUNTER — Other Ambulatory Visit (HOSPITAL_BASED_OUTPATIENT_CLINIC_OR_DEPARTMENT_OTHER): Payer: Self-pay

## 2024-01-25 MED ORDER — AREXVY 120 MCG/0.5ML IM SUSR
0.5000 mL | Freq: Once | INTRAMUSCULAR | 0 refills | Status: AC
  Filled 2024-01-25: qty 0.5, 1d supply, fill #0

## 2024-03-26 ENCOUNTER — Ambulatory Visit: Admitting: Sports Medicine

## 2024-03-26 ENCOUNTER — Other Ambulatory Visit (INDEPENDENT_AMBULATORY_CARE_PROVIDER_SITE_OTHER): Payer: Self-pay

## 2024-03-26 ENCOUNTER — Encounter: Payer: Self-pay | Admitting: Sports Medicine

## 2024-03-26 DIAGNOSIS — M25562 Pain in left knee: Secondary | ICD-10-CM

## 2024-03-26 DIAGNOSIS — G8929 Other chronic pain: Secondary | ICD-10-CM

## 2024-03-26 DIAGNOSIS — R4701 Aphasia: Secondary | ICD-10-CM | POA: Diagnosis not present

## 2024-03-26 DIAGNOSIS — M1711 Unilateral primary osteoarthritis, right knee: Secondary | ICD-10-CM | POA: Diagnosis not present

## 2024-03-26 DIAGNOSIS — Z9889 Other specified postprocedural states: Secondary | ICD-10-CM | POA: Diagnosis not present

## 2024-03-26 MED ORDER — LIDOCAINE HCL 1 % IJ SOLN
2.0000 mL | INTRAMUSCULAR | Status: AC | PRN
  Administered 2024-03-26: 2 mL

## 2024-03-26 MED ORDER — BUPIVACAINE HCL 0.25 % IJ SOLN
2.0000 mL | INTRAMUSCULAR | Status: AC | PRN
  Administered 2024-03-26: 2 mL via INTRA_ARTICULAR

## 2024-03-26 MED ORDER — METHYLPREDNISOLONE ACETATE 40 MG/ML IJ SUSP
40.0000 mg | INTRAMUSCULAR | Status: AC | PRN
  Administered 2024-03-26: 40 mg via INTRA_ARTICULAR

## 2024-03-26 NOTE — Progress Notes (Signed)
 Kathleen Neal - 88 y.o. female MRN [id number]  Date of birth: February 10, 1936  Office Visit Note: Visit Date: 03/26/2024 PCP: Aldo Hun, MD Referred by: Kathleen Converse PA-C  Subjective: Chief Complaint  Patient presents with   Left Knee - Pain   HPI: Kathleen Neal is a pleasant 88 y.o. female who presents today for evaluation of chronic left knee pain. She does present with a friend/care representative, Mitrina, who helps with some of HPI.   Remote history of medial meniscus tear with surgical intervention in years past (menisectomy) - back in 2014.  Ever since this she has continued to have knee pain.  Her pain is more so on the medial aspect of the knee joint.  Denies any significant swelling. Pain is worse with walking and she enjoys at her facility. Per Mitrina, she has not had any injections or taking any medication for her condition.  She has a history of aphasia as well as dementia - she is on donepezil 10 mg daily  She lives at [facility] - she is here with Mitrina, her coordinator.  She did bring paperwork from [facility] including her last visit from 08/10/2023 as well as her medication list and recent labs from 08/10/2023 which I reviewed today: - Vitamin B12 903 - CBC with hemoglobin of 12.6, no leukocytosis with a white blood cell count of 8.7 - Creatinine 1.25 with EGFR 42  Pertinent ROS were reviewed with the patient and found to be negative unless otherwise specified above in HPI.   Assessment & Plan: Visit Diagnoses:  1. Unilateral primary osteoarthritis, right knee   2. Chronic pain of left knee   3. History of medial meniscus repair of left knee   4. Aphasia    - 1 chronic with exacerbation (with 2nd chronic requiring alternative historian) and 3+ data review  Plan: Impression is chronic right medial knee pain with osteoarthritis with acute exacerbation that is limiting her walking and physical activity. She does have at least moderate medial  tibiofemoral joint space narrowing, this is in the setting of a prior medial meniscectomy for meniscal repair back in 2014.  Through shared decision-making, we did proceed with knee corticosteroid injection. Advised on postinjection protocol.  She may use ice/heat or Tylenol/OTC anti-inflammatories for any postinjection pain.  Given her CKD stage IIIb, would be okay with as needed NSAIDs but not consistent use. She may then get back into her routine activity after 48 hours.  She will follow-up with me as needed.  Follow-up: Return if symptoms worsen or fail to improve.   Meds & Orders: No orders of the defined types were placed in this encounter.   Orders Placed This Encounter  Procedures   Large Joint Inj: L knee   XR Knee Complete 4 Views Left     Procedures: Large Joint Inj: L knee on 03/26/2024 3:13 PM Indications: pain Details: 22 G 1.5 in needle, anteromedial approach Medications: 2 mL lidocaine  1 %; 2 mL bupivacaine  0.25 %; 40 mg methylPREDNISolone  acetate 40 MG/ML Outcome: tolerated well, no immediate complications  Knee Injection, Left: After discussion on risks/benefits/indications, informed verbal consent was obtained and a timeout was performed, patient was seated on exam table. The patient's knee was prepped with Betadine and alcohol swab and utilizing anteromedial approach, the patient's knee was injected intraarticularly with 2:2:1 lidocaine  1%:bupivicaine 0.25%:depomedrol. Patient tolerated the procedure well without immediate complications.  Procedure, treatment alternatives, risks and benefits explained, specific risks discussed. Consent was given by the  patient. Patient was prepped and draped in the usual sterile fashion.          Clinical History: No specialty comments available.  She reports that she quit smoking about 21 years ago. Her smoking use included cigarettes. She started smoking about 52 years ago. She has a 30 pack-year smoking history. She has never used  smokeless tobacco. No results for input(s): "HGBA1C", "LABURIC" in the last 8760 hours.  Objective:   Vital Signs: LMP 11/08/1985 (Approximate)   Physical Exam  Gen: Well-appearing, in no acute distress; non-toxic CV: Well-perfused. Warm.  Resp: Breathing unlabored on room air; no wheezing. Psych: Fluid speech in conversation; appropriate affect; normal thought process  Ortho Exam - Left knee: + Range of motion from 0-125 degrees compared to 0-135 degrees of the contralateral knee.  There is no significant joint effusion noted.  No varus or valgus instability.  Imaging: XR Knee Complete 4 Views Left Result Date: 03/26/2024 4 view x-ray of the left knee including standing AP, Rosenberg, lateral and sunrise view were ordered and reviewed by myself.  X-rays show mild tricompartmental arthritic change with at least moderate medial tibiofemoral joint space narrowing on [place] view.  No acute bony fracture or acute bony abnormality otherwise noted.   Past Medical/Family/Surgical/Social History: Medications & Allergies reviewed per EMR, new medications updated. Patient Active Problem List   Diagnosis Date Noted   Mild cognitive impairment 08/22/2023   Essential hypertension 08/22/2023   Mixed hyperlipidemia 08/22/2023   Aphasia 08/22/2023   B12 deficiency 08/22/2023   Cerebrovascular disease 08/22/2023   Perineal abscess 02/24/2017   GERD 02/06/2008   BARRETTS ESOPHAGUS 02/06/2008   ADENOCARCINOMA, BREAST, HX OF 02/06/2008   HELICOBACTER PYLORI GASTRITIS, HX OF 02/06/2008   Past Medical History:  Diagnosis Date   ADENOCARCINOMA, BREAST, HX OF 02/06/2008   Qualifier: Diagnosis of   By: Alphonsa Jasper CMA (AAMA), Amanda         Aphasia 08/22/2023   Barrett's esophagus 2014   Cataracts, bilateral    Cerebrovascular disease 08/22/2023   Essential hypertension 08/22/2023   GERD (gastroesophageal reflux disease)    occasional - diet controlled   Headache    occasional   Hearing loss     left   Hyperlipidemia    lt breast ca dx'd 2003   chemo/xrt comp/ left   Mild cognitive impairment 08/22/2023   Neuropathy    both feet    Family History  Problem Relation Age of Onset   Cancer Mother        uterine   Stroke Father 70   Cancer Sister        pancreatic   Colon cancer Neg Hx    Past Surgical History:  Procedure Laterality Date   BREAST LUMPECTOMY  2003   left - bx   COLONOSCOPY     last colon 2005   DILATATION & CURETTAGE/HYSTEROSCOPY WITH MYOSURE N/A 02/14/2017   Procedure: DILATATION & CURETTAGE/HYSTEROSCOPY WITH MYOSURE;  Surgeon: Wanita Gutta, MD;  Location: [facility];  Service: Gynecology;  Laterality: N/A;  follow first case   Left knee  2014   meniscus   POLYPECTOMY  2000   UPPER GI ENDOSCOPY     Social History   Occupational History   Not on file  Tobacco Use   Smoking status: Former    Current packs/day: 0.00    Average packs/day: 1 pack/day for 30.0 years (30.0 ttl pk-yrs)    Types: Cigarettes    Start date: 04/08/1972  Quit date: 04/08/2002    Years since quitting: 21.9   Smokeless tobacco: Never  Vaping Use   Vaping status: Never Used  Substance and Sexual Activity   Alcohol use: Yes    Alcohol/week: 14.0 standard drinks of alcohol    Types: 14 Glasses of wine per week   Drug use: No   Sexual activity: Never    Partners: Male    Birth control/protection: Post-menopausal

## 2024-03-26 NOTE — Progress Notes (Signed)
 Patient has had left knee pain for years. She points more towards the medial knee when describing her pain today. She did have a meniscal injury in the past. She is not currently taking medication or using ice/heat to treat her knee pain. Patient is inquiring about injection today.

## 2024-07-23 ENCOUNTER — Ambulatory Visit: Admitting: Physician Assistant

## 2024-08-21 ENCOUNTER — Ambulatory Visit: Admitting: Physician Assistant

## 2024-09-07 ENCOUNTER — Encounter (INDEPENDENT_AMBULATORY_CARE_PROVIDER_SITE_OTHER): Payer: Self-pay

## 2024-09-10 ENCOUNTER — Encounter: Payer: Self-pay | Admitting: Radiology

## 2024-10-16 ENCOUNTER — Ambulatory Visit (INDEPENDENT_AMBULATORY_CARE_PROVIDER_SITE_OTHER): Admitting: Otolaryngology

## 2024-10-16 ENCOUNTER — Encounter (INDEPENDENT_AMBULATORY_CARE_PROVIDER_SITE_OTHER): Payer: Self-pay | Admitting: Otolaryngology

## 2024-10-16 NOTE — Progress Notes (Unsigned)
 Dear Dr. Isaias, Here is my assessment for our mutual patient, Kathleen Neal. Thank you for allowing me the opportunity to care for your patient. Please do not hesitate to contact me should you have any other questions. Sincerely, Dr. Eldora Blanch  Otolaryngology Clinic Note Referring provider: Dr. Isaias HPI:  Kathleen Neal is a 88 y.o. female kindly referred by Dr. 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  She has primary progressive aphasia.  Patient denies: ear pain, fullness, vertigo, drainage, tinnitus Patient additionally denies: deep pain in ear canal, eustachian tube symptoms such as popping, crackling, sensitive to pressure changes Patient also denies barotrauma, vestibular suppressant use, ototoxic medication use Prior ear surgery: ***    H&N Surgery: *** Personal or FHx of bleeding dz or anesthesia difficulty: no ***  GLP-1: *** AP/AC: ***  Tobacco: ***. Alcohol: ***. Occupation: ***. Lives in *** with ***.  Independent Review of Additional Tests or Records:  ***   PMH/Meds/All/SocHx/FamHx/ROS:   Past Medical History:  Diagnosis Date   ADENOCARCINOMA, BREAST, HX OF 02/06/2008   Qualifier: Diagnosis of   By: Earlean CMA (AAMA), Amanda         Aphasia 08/22/2023   Barrett's esophagus 2014   Cataracts, bilateral    Cerebrovascular disease 08/22/2023   Essential hypertension 08/22/2023   GERD (gastroesophageal reflux disease)    occasional - diet controlled   Headache    occasional   Hearing loss    left   Hyperlipidemia    lt breast ca dx'd 2003   chemo/xrt comp/ left   Mild cognitive impairment 08/22/2023   Neuropathy    both feet      Past Surgical History:  Procedure Laterality Date   BREAST LUMPECTOMY  2003   left - bx   COLONOSCOPY     last colon 2005   DILATATION & CURETTAGE/HYSTEROSCOPY WITH MYOSURE N/A  02/14/2017   Procedure: DILATATION & CURETTAGE/HYSTEROSCOPY WITH MYOSURE;  Surgeon: Kate Hargis Nearing, MD;  Location: [facility];  Service: Gynecology;  Laterality: N/A;  follow first case   Left knee  2014   meniscus   POLYPECTOMY  2000   UPPER GI ENDOSCOPY      Family History  Problem Relation Age of Onset   Cancer Mother        uterine   Stroke Father 6   Cancer Sister        pancreatic   Colon cancer Neg Hx      Social Connections: Not on file      Current Outpatient Medications:    acetaminophen (TYLENOL) 325 MG tablet, Take 325 mg by mouth daily as needed for moderate pain or headache., Disp: , Rfl:    Biotin 5000 MCG CAPS, Take 5,000 mcg by mouth daily., Disp: , Rfl:    Cholecalciferol (VITAMIN D PO), Take 1 capsule by mouth daily. , Disp: , Rfl:    COVID-19 mRNA bivalent vaccine, Moderna, (MODERNA COVID-19 BIVAL BOOSTER) 50 MCG/0.5ML injection, Inject into the muscle., Disp: 0.5 mL, Rfl: 0   COVID-19 mRNA vaccine, Moderna, 100 MCG/0.5ML injection, Inject into the muscle., Disp: 0.25 mL, Rfl: 0   influenza vaccine adjuvanted (FLUAD) 0.5 ML injection, Inject into the muscle., Disp: 0.5 mL, Rfl: 0   mirtazapine (REMERON) 15 MG tablet, Take 15 mg by mouth at bedtime., Disp: , Rfl:    Tetrahydrozoline HCl (VISINE OP), Apply 1 drop to eye  daily as needed (dry eyes)., Disp: , Rfl:    aspirin 81 MG tablet, Take 81 mg by mouth daily.  (Patient not taking: Reported on 10/16/2024), Disp: , Rfl:    donepezil (ARICEPT) 5 MG tablet, TAKE 1 TABLET BY MOUTH ONCE EVERY EVENING (Patient not taking: Reported on 10/16/2024), Disp: , Rfl: 11   Flaxseed, Linseed, (FLAX SEED OIL) 1000 MG CAPS, Take 1,000 mg by mouth daily. (Patient not taking: Reported on 10/16/2024), Disp: , Rfl:    metroNIDAZOLE  (FLAGYL ) 500 MG tablet, Take 1 tablet (500 mg total) by mouth 2 (two) times daily. (Patient not taking: Reported on 10/16/2024), Disp: 14 tablet, Rfl: 0   pravastatin (PRAVACHOL) 40 MG tablet, Take 40 mg by  mouth at bedtime. (Patient not taking: Reported on 10/16/2024), Disp: , Rfl: 0   zolpidem (AMBIEN) 5 MG tablet, Take 0.5 tablets by mouth at bedtime.  (Patient not taking: Reported on 10/16/2024), Disp: , Rfl: 0   Physical Exam:   Pulse (!) 55   Ht 5' 2 (1.575 m)   Wt 130 lb (59 kg)   LMP 11/08/1985 (Approximate)   SpO2 94%   BMI 23.78 kg/m   Salient findings:  CN II-XII intact *** Bilateral EAC clear and TM intact with well pneumatized middle ear spaces Weber 512: *** Rinne 512: AC > BC b/l *** Rine 1024: AC > BC b/l *** Anterior rhinoscopy: Septum ***; bilateral inferior turbinates with *** No lesions of oral cavity/oropharynx; dentition *** No obviously palpable neck masses/lymphadenopathy/thyromegaly No respiratory distress or stridor***  Seprately Identifiable Procedures:  Prior to initiating any procedures, risks/benefits/alternatives were explained to the patient and verbal consent obtained. None*** Bilateral cerumen impction  Impression & Plans:  Kathleen Neal is a 88 y.o. female with ***  No diagnosis found.   - f/u ***  See below regarding exact medications prescribed this encounter including dosages and route: No orders of the defined types were placed in this encounter.     Thank you for allowing me the opportunity to care for your patient. Please do not hesitate to contact me should you have any other questions.  Sincerely, Eldora Blanch, MD Otolaryngologist (ENT), [facility] Phone: [phone] Fax: [phone]  10/16/2024, 3:17 PM   MDM:  Level *** Complexity/Problems addressed: *** Data complexity: *** independent review of *** - Morbidity: ***  - Prescription Drug prescribed or managed: ***

## 2024-11-12 NOTE — Progress Notes (Signed)
 "   Memory impairment, concern for vascular and as Alzheimer's  disease Combine receptive and expressive PPA   Kathleen Neal is a delightful 89 y.o. RH female from [place] with a history of hypertension, hyperlipidemia, insomnia, B12 deficiency, history of carotid artery stenosis, history of TIA in 2015, history of breast cancer status post lumpectomy, chemo and radiation, chemo induced neuropathy, arthritis, history of Barrett's esophagus seen today in follow up for memory loss. Patient is currently on donepezil 10 mg daily.  This patient is accompanied in the office by her nurse (healthcare advocate) who supplements the history.  Previous records as well as any outside records available were reviewed prior to todays visit. Patient was last seen on 01/20/2024. Memory and speech decline is noted Patient is able to participate on ADLs  to her ability and has a driver to take her to places safely. Mood is improved with mirtazapine.    Continue donepezil 10 mg daily, side effects discussed Continue with speech therapy for expressive aphasia Recommend good control of cardiovascular risk factors.   Continue to control mood as per PCP Continue using the hearing aids in an effort to improve comprehension Follow up in 6 months   Discussed the use of AI scribe software for clinical note transcription with the patient, who gave verbal consent to proceed.  History of Present Illness  Kathleen Neal is an 89 year old female who presents for evaluation of  memory loss. She is accompanied by Kathleen Neal, her health care advocate   She has been experiencing worsening changes in her speech and memory, with expressive aphasia being the primary concern. She struggles to find the correct words and often repeats the phrase 'main building' to describe various things. This change has been consistent over the past four weeks. Previously, she could express herself better in writing, but now she also faces difficulties in  writing sentences, although she can still sign her name clearly.  Her memory has been relatively stable until about a month ago. Family members and caregivers have noticed that she sometimes struggles to remember people she has not seen in a while, such as her niece Kathleen Neal. She also experiences occasional difficulty selecting food from a menu, which is a new development. She has new hearing aids, which she manages independently, and they are functioning well.   She has been taking mirtazapine since July, prescribed by Dr. Charmaine, which has helped with her sleep, mood, and anxiety. She previously experienced insomnia and mood issues before starting this medication.   Her appetite is good, and she has no issues with swallowing or choking  No significant headaches, hallucinations, or paranoia. She is aware of her surroundings and can navigate her living space independently. She occasionally misplaces items but can eventually find them.   No recent falls, head injuries, or symptoms of a stroke. She has a history of left knee arthritis, which has been managed with an injection from Dr. Burnetta at [facility]. This has reduced her complaints of pain, although she still remembers the knee problems .   She experienced constipation last month, which resolved in three days. Denies any urinary complaints   She resides at [facility] independent living excellent support with caregivers, driver, and the family would prefer to keep her where she is at now in place of memory care as long as possible.   MRI of the brain, personally reviewed, 11/01/2023 without evidence of acute intracranial abnormality, remarkable for chronic small vessel ischemic changes within the cerebral  white matter and pons, moderate in severity.  These findings are similar to the prior MRI of the brain in December 2018.  There is mild to moderate generalized cerebral atrophy           No data to display             01/16/2018    1:55 PM  Montreal Cognitive Assessment   Visuospatial/ Executive (0/5) 4  Naming (0/3) 3  Attention: Read list of digits (0/2) 2  Attention: Read list of letters (0/1) 1  Attention: Serial 7 subtraction starting at 100 (0/3) 2  Language: Repeat phrase (0/2) 2  Language : Fluency (0/1) 0  Abstraction (0/2) 2  Delayed Recall (0/5) 0  Orientation (0/6) 5  Total 21  Adjusted Score (based on education) 21      Objective:    Neurological Exam:    VITALS:   Vitals:   11/14/24 1343  BP: 132/76  Pulse: 71  SpO2: 97%  Weight: 137 lb 3.2 oz (62.2 kg)    GEN:  The patient appears stated age and is in NAD. HEENT:  Normocephalic, atraumatic.   Neurological examination:  General: NAD, well-groomed, appears stated age. Orientation: The patient is alert. Oriented to person, not to place and date Cranial nerves: There is good facial symmetry.The speech is not fluent, truncated at times, intermittently tangential, but clear.  She repeats main building  to refer to several unrelated different things within the same sentence.  She has aphasia, no dysarthria.  Fund of knowledge is reduced. Recent and remote memory are impaired. Attention and concentration are normal.  Unable to name objects and unable to repeat phrases.  Hearing is improved to conversational tone with the hearing aids Sensation: Sensation is intact to light touch throughout Motor: Strength is at least antigravity x4. DTR's 2/4 in UE/LE     Movement examination:  Tone: There is normal tone in the UE/LE Abnormal movements:  no tremor.  No myoclonus.  No asterixis.   Coordination:  There is decremation with RAM's. Normal FTN Gait and Station: The patient has no difficulty arising out of a deep-seated chair without the use of the hands. The patient's stride length is good.  Gait is cautious and narrow.    Thank you for allowing us  the opportunity to participate in the care of this nice patient. Please do not  hesitate to contact us  for any questions or concerns.   Total time spent on today's visit was 34 minutes dedicated to this patient today, preparing to see patient, examining the patient, ordering tests and/or medications and counseling the patient, documenting clinical information in the EHR or other health record, independently interpreting results and communicating results to the patient/family, discussing treatment and goals, answering patient's questions and coordinating care.  Cc:  Shayne Anes, MD  Camie Sevin 11/15/2024 5:49 AM      "

## 2024-11-14 ENCOUNTER — Ambulatory Visit: Admitting: Physician Assistant

## 2024-11-14 VITALS — BP 132/76 | HR 71 | Wt 137.2 lb

## 2024-11-14 DIAGNOSIS — R413 Other amnesia: Secondary | ICD-10-CM

## 2024-11-14 DIAGNOSIS — R4701 Aphasia: Secondary | ICD-10-CM

## 2024-11-14 NOTE — Patient Instructions (Addendum)
 It was a pleasure to see you today at our office.   Recommendations:   Follow up in 6 months Continue donepezil  10 mg daily. Side effects were discussed      [email or web address]-[email or web address]   RECOMMENDATIONS FOR ALL PATIENTS WITH MEMORY PROBLEMS: 1. Continue to exercise (Recommend 30 minutes of walking everyday, or 3 hours every week) 2. Increase social interactions - continue going to [place] and enjoy social gatherings with friends and family 3. Eat healthy, avoid fried foods and eat more fruits and vegetables 4. Maintain adequate blood pressure, blood sugar, and blood cholesterol level. Reducing the risk of stroke and cardiovascular disease also helps promoting better memory. 5. Avoid stressful situations. Live a simple life and avoid aggravations. Organize your time and prepare for the next day in anticipation. 6. Sleep well, avoid any interruptions of sleep and avoid any distractions in the bedroom that may interfere with adequate sleep quality 7. Avoid sugar, avoid sweets as there is a strong link between excessive sugar intake, diabetes, and cognitive impairment We discussed the Mediterranean diet, which has been shown to help patients reduce the risk of progressive memory disorders and reduces cardiovascular risk. This includes eating fish, eat fruits and green leafy vegetables, nuts like almonds and hazelnuts, walnuts, and also use olive oil. Avoid fast foods and fried foods as much as possible. Avoid sweets and sugar as sugar use has been linked to worsening of memory function.  There is always a concern of gradual progression of memory problems. If this is the case, then we may need to adjust level of care according to patient needs. Support, both to the patient and caregiver, should then be put into place.        FALL PRECAUTIONS: Be cautious when walking. Scan the area for obstacles that may increase the risk of trips and falls. When  getting up in the mornings, sit up at the edge of the bed for a few minutes before getting out of bed. Consider elevating the bed at the head end to avoid drop of blood pressure when getting up. Walk always in a well-lit room (use night lights in the walls). Avoid area rugs or power cords from appliances in the middle of the walkways. Use a walker or a cane if necessary and consider physical therapy for balance exercise. Get your eyesight checked regularly.  FINANCIAL OVERSIGHT: Supervision, especially oversight when making financial decisions or transactions is also recommended.  HOME SAFETY: Consider the safety of the kitchen when operating appliances like stoves, microwave oven, and blender. Consider having supervision and share cooking responsibilities until no longer able to participate in those. Accidents with firearms and other hazards in the house should be identified and addressed as well.   ABILITY TO BE LEFT ALONE: If patient is unable to contact 911 operator, consider using LifeLine, or when the need is there, arrange for someone to stay with patients. Smoking is a fire hazard, consider supervision or cessation. Risk of wandering should be assessed by caregiver and if detected at any point, supervision and safe proof recommendations should be instituted.  MEDICATION SUPERVISION: Inability to self-administer medication needs to be constantly addressed. Implement a mechanism to ensure safe administration of the medications.      Mediterranean Diet A Mediterranean diet refers to food and lifestyle choices that are based on the traditions of countries located on the [employer]. This way of eating has been shown to help prevent certain conditions and improve outcomes for people who have chronic diseases,  like kidney disease and heart disease. What are tips for following this plan? Lifestyle  Cook and eat meals together with your family, when possible. Drink enough fluid to keep your  urine clear or pale yellow. Be physically active every day. This includes: Aerobic exercise like running or swimming. Leisure activities like gardening, walking, or housework. Get 7-8 hours of sleep each night. If recommended by your health care provider, drink red wine in moderation. This means 1 glass a day for nonpregnant women and 2 glasses a day for men. A glass of wine equals 5 oz (150 mL). Reading food labels  Check the serving size of packaged foods. For foods such as rice and pasta, the serving size refers to the amount of cooked product, not dry. Check the total fat in packaged foods. Avoid foods that have saturated fat or trans fats. Check the ingredients list for added sugars, such as corn syrup. Shopping  At the grocery store, buy most of your food from the areas near the walls of the store. This includes: Fresh fruits and vegetables (produce). Grains, beans, nuts, and seeds. Some of these may be available in unpackaged forms or large amounts (in bulk). Fresh seafood. Poultry and eggs. Low-fat dairy products. Buy whole ingredients instead of prepackaged foods. Buy fresh fruits and vegetables in-season from local farmers markets. Buy frozen fruits and vegetables in resealable bags. If you do not have access to quality fresh seafood, buy precooked frozen shrimp or canned fish, such as tuna, salmon, or sardines. Buy small amounts of raw or cooked vegetables, salads, or olives from the deli or salad bar at your store. Stock your pantry so you always have certain foods on hand, such as olive oil, canned tuna, canned tomatoes, rice, pasta, and beans. Cooking  Cook foods with extra-virgin olive oil instead of using butter or other vegetable oils. Have meat as a side dish, and have vegetables or grains as your main dish. This means having meat in small portions or adding small amounts of meat to foods like pasta or stew. Use beans or vegetables instead of meat in common dishes like  chili or lasagna. Experiment with different cooking methods. Try roasting or broiling vegetables instead of steaming or sauteing them. Add frozen vegetables to soups, stews, pasta, or rice. Add nuts or seeds for added healthy fat at each meal. You can add these to yogurt, salads, or vegetable dishes. Marinate fish or vegetables using olive oil, lemon juice, garlic, and fresh herbs. Meal planning  Plan to eat 1 vegetarian meal one day each week. Try to work up to 2 vegetarian meals, if possible. Eat seafood 2 or more times a week. Have healthy snacks readily available, such as: Vegetable sticks with hummus. Greek yogurt. Fruit and nut trail mix. Eat balanced meals throughout the week. This includes: Fruit: 2-3 servings a day Vegetables: 4-5 servings a day Low-fat dairy: 2 servings a day Fish, poultry, or lean meat: 1 serving a day Beans and legumes: 2 or more servings a week Nuts and seeds: 1-2 servings a day Whole grains: 6-8 servings a day Extra-virgin olive oil: 3-4 servings a day Limit red meat and sweets to only a few servings a month What are my food choices? Mediterranean diet Recommended Grains: Whole-grain pasta. Brown rice. Bulgar wheat. Polenta. Couscous. Whole-wheat bread. [place] Made[place]. Vegetables: Artichokes. Beets. Broccoli. Cabbage. Carrots. Eggplant. Green beans. Chard. Kale. Spinach. Onions. Leeks. Peas. Squash. Tomatoes. Peppers. Radishes. Fruits: Apples. Apricots. Avocado. Berries. Bananas. Cherries. Dates. Figs. Grapes.  Lemons. Melon. Oranges. Peaches. Plums. Pomegranate. Meats and other protein foods: Beans. Almonds. Sunflower seeds. Pine nuts. Peanuts. Cod. Salmon. Scallops. Shrimp. Tuna. Tilapia. Clams. Oysters. Eggs. Dairy: Low-fat milk. Cheese. Greek yogurt. Beverages: Water . Red wine. Herbal tea. Fats and oils: Extra virgin olive oil. Avocado oil. Grape seed oil. Sweets and desserts: [place] yogurt with honey. Baked apples. Poached pears. Trail  mix. Seasoning and other foods: Basil. Cilantro. Coriander. Cumin. Mint. Parsley. Sage. Rosemary. Tarragon. Garlic. Oregano. Thyme. Pepper. Balsalmic vinegar. Tahini. Hummus. Tomato sauce. Olives. Mushrooms. Limit these Grains: Prepackaged pasta or rice dishes. Prepackaged cereal with added sugar. Vegetables: Deep fried potatoes (french fries). Fruits: Fruit canned in syrup. Meats and other protein foods: Beef. Pork. Lamb. Poultry with skin. Hot dogs. [place]. Dairy: Ice cream. Sour cream. Whole milk. Beverages: Juice. Sugar-sweetened soft drinks. Beer. Liquor and spirits. Fats and oils: Butter. Canola oil. Vegetable oil. Beef fat (tallow). Lard. Sweets and desserts: Cookies. Cakes. Pies. Candy. Seasoning and other foods: Mayonnaise. Premade sauces and marinades. The items listed may not be a complete list. Talk with your dietitian about what dietary choices are right for you. Summary The Mediterranean diet includes both food and lifestyle choices. Eat a variety of fresh fruits and vegetables, beans, nuts, seeds, and whole grains. Limit the amount of red meat and sweets that you eat. Talk with your health care provider about whether it is safe for you to drink red wine in moderation. This means 1 glass a day for nonpregnant women and 2 glasses a day for men. A glass of wine equals 5 oz (150 mL). This information is not intended to replace advice given to you by your health care provider. Make sure you discuss any questions you have with your health care provider. Document Released: 06/17/2016 Document Revised: 07/20/2016 Document Reviewed: 06/17/2016 Elsevier Interactive Patient Education  2017 [employer].

## 2025-05-21 ENCOUNTER — Ambulatory Visit: Payer: Self-pay | Admitting: Physician Assistant
